# Patient Record
Sex: Male | Born: 1945 | Race: Black or African American | Hispanic: No | Smoking: Former smoker
Health system: Southern US, Community
[De-identification: ages and names within clinical notes are randomized; demographics above are authoritative.]

## PROBLEM LIST (undated history)

## (undated) DIAGNOSIS — Z8719 Personal history of other diseases of the digestive system: Secondary | ICD-10-CM

## (undated) DIAGNOSIS — K219 Gastro-esophageal reflux disease without esophagitis: Secondary | ICD-10-CM

## (undated) DIAGNOSIS — I10 Essential (primary) hypertension: Secondary | ICD-10-CM

## (undated) HISTORY — PX: ROTATOR CUFF REPAIR: SHX139

## (undated) HISTORY — PX: HERNIA REPAIR: SHX51

## (undated) HISTORY — PX: JOINT REPLACEMENT: SHX530

---

## 1999-01-26 ENCOUNTER — Ambulatory Visit (HOSPITAL_COMMUNITY): Admission: RE | Admit: 1999-01-26 | Discharge: 1999-01-26 | Payer: Self-pay | Admitting: *Deleted

## 1999-07-07 ENCOUNTER — Other Ambulatory Visit: Admission: RE | Admit: 1999-07-07 | Discharge: 1999-07-07 | Payer: Self-pay | Admitting: Specialist

## 2000-01-26 ENCOUNTER — Ambulatory Visit (HOSPITAL_COMMUNITY): Admission: RE | Admit: 2000-01-26 | Discharge: 2000-01-26 | Payer: Self-pay

## 2000-07-11 ENCOUNTER — Encounter (INDEPENDENT_AMBULATORY_CARE_PROVIDER_SITE_OTHER): Payer: Self-pay

## 2000-07-11 ENCOUNTER — Ambulatory Visit (HOSPITAL_COMMUNITY): Admission: RE | Admit: 2000-07-11 | Discharge: 2000-07-11 | Payer: Self-pay | Admitting: *Deleted

## 2003-05-01 ENCOUNTER — Ambulatory Visit (HOSPITAL_COMMUNITY): Admission: RE | Admit: 2003-05-01 | Discharge: 2003-05-01 | Payer: Self-pay | Admitting: *Deleted

## 2003-05-01 ENCOUNTER — Encounter (INDEPENDENT_AMBULATORY_CARE_PROVIDER_SITE_OTHER): Payer: Self-pay | Admitting: Specialist

## 2006-08-02 ENCOUNTER — Encounter (INDEPENDENT_AMBULATORY_CARE_PROVIDER_SITE_OTHER): Payer: Self-pay | Admitting: Specialist

## 2006-08-02 ENCOUNTER — Ambulatory Visit (HOSPITAL_COMMUNITY): Admission: RE | Admit: 2006-08-02 | Discharge: 2006-08-02 | Payer: Self-pay | Admitting: *Deleted

## 2008-08-06 ENCOUNTER — Ambulatory Visit (HOSPITAL_COMMUNITY): Admission: RE | Admit: 2008-08-06 | Discharge: 2008-08-06 | Payer: Self-pay | Admitting: *Deleted

## 2009-02-20 ENCOUNTER — Encounter: Admission: RE | Admit: 2009-02-20 | Discharge: 2009-02-20 | Payer: Self-pay | Admitting: *Deleted

## 2009-08-15 ENCOUNTER — Encounter: Admission: RE | Admit: 2009-08-15 | Discharge: 2009-08-15 | Payer: Self-pay | Admitting: Internal Medicine

## 2009-10-07 ENCOUNTER — Other Ambulatory Visit: Admission: RE | Admit: 2009-10-07 | Discharge: 2009-10-07 | Payer: Self-pay | Admitting: Otolaryngology

## 2009-11-16 ENCOUNTER — Emergency Department (HOSPITAL_COMMUNITY): Admission: EM | Admit: 2009-11-16 | Discharge: 2009-11-16 | Payer: Self-pay | Admitting: Emergency Medicine

## 2009-11-24 ENCOUNTER — Ambulatory Visit (HOSPITAL_COMMUNITY): Admission: RE | Admit: 2009-11-24 | Discharge: 2009-11-25 | Payer: Self-pay | Admitting: Otolaryngology

## 2009-11-24 ENCOUNTER — Encounter (INDEPENDENT_AMBULATORY_CARE_PROVIDER_SITE_OTHER): Payer: Self-pay | Admitting: Otolaryngology

## 2010-04-13 ENCOUNTER — Inpatient Hospital Stay (HOSPITAL_COMMUNITY): Admission: RE | Admit: 2010-04-13 | Discharge: 2010-04-14 | Payer: Self-pay | Admitting: Specialist

## 2011-01-11 LAB — DIFFERENTIAL
Basophils Absolute: 0.1 10*3/uL (ref 0.0–0.1)
Basophils Relative: 1 % (ref 0–1)
Eosinophils Absolute: 0.2 10*3/uL (ref 0.0–0.7)
Eosinophils Relative: 2 % (ref 0–5)
Lymphocytes Relative: 25 % (ref 12–46)
Lymphs Abs: 2.2 10*3/uL (ref 0.7–4.0)
Monocytes Absolute: 0.6 10*3/uL (ref 0.1–1.0)
Monocytes Relative: 7 % (ref 3–12)
Neutro Abs: 5.8 10*3/uL (ref 1.7–7.7)
Neutrophils Relative %: 65 % (ref 43–77)

## 2011-01-11 LAB — CBC
HCT: 42.2 % (ref 39.0–52.0)
HCT: 45.4 % (ref 39.0–52.0)
Hemoglobin: 14.5 g/dL (ref 13.0–17.0)
Hemoglobin: 15.6 g/dL (ref 13.0–17.0)
MCHC: 34.3 g/dL (ref 30.0–36.0)
MCHC: 34.4 g/dL (ref 30.0–36.0)
MCV: 100.4 fL — ABNORMAL HIGH (ref 78.0–100.0)
MCV: 101.3 fL — ABNORMAL HIGH (ref 78.0–100.0)
Platelets: 166 10*3/uL (ref 150–400)
Platelets: 176 10*3/uL (ref 150–400)
RBC: 4.21 MIL/uL — ABNORMAL LOW (ref 4.22–5.81)
RBC: 4.48 MIL/uL (ref 4.22–5.81)
RDW: 13 % (ref 11.5–15.5)
RDW: 13.5 % (ref 11.5–15.5)
WBC: 8.5 10*3/uL (ref 4.0–10.5)
WBC: 8.9 10*3/uL (ref 4.0–10.5)

## 2011-01-11 LAB — BASIC METABOLIC PANEL
BUN: 15 mg/dL (ref 6–23)
BUN: 18 mg/dL (ref 6–23)
CO2: 24 mEq/L (ref 19–32)
CO2: 25 mEq/L (ref 19–32)
Calcium: 9.8 mg/dL (ref 8.4–10.5)
Calcium: 9.8 mg/dL (ref 8.4–10.5)
Chloride: 101 mEq/L (ref 96–112)
Chloride: 105 mEq/L (ref 96–112)
Creatinine, Ser: 0.82 mg/dL (ref 0.4–1.5)
Creatinine, Ser: 1.04 mg/dL (ref 0.4–1.5)
GFR calc Af Amer: >60 mL/min (ref >60)
GFR calc Af Amer: >60 mL/min (ref >60)
GFR calc non Af Amer: >60 mL/min (ref >60)
GFR calc non Af Amer: >60 mL/min (ref >60)
Glucose, Bld: 101 mg/dL — ABNORMAL HIGH (ref 70–99)
Glucose, Bld: 101 mg/dL — ABNORMAL HIGH (ref 70–99)
Potassium: 3.2 mEq/L — ABNORMAL LOW (ref 3.5–5.1)
Potassium: 4.1 mEq/L (ref 3.5–5.1)
Sodium: 133 mEq/L — ABNORMAL LOW (ref 135–145)
Sodium: 138 mEq/L (ref 135–145)

## 2011-01-11 LAB — SURGICAL PCR SCREEN
MRSA, PCR: NEGATIVE
Staphylococcus aureus: NEGATIVE

## 2011-01-11 LAB — POTASSIUM: Potassium: 4.2 mEq/L (ref 3.5–5.1)

## 2011-01-11 LAB — PROTIME-INR
INR: 0.91 (ref 0.00–1.49)
Prothrombin Time: 12.2 seconds (ref 11.6–15.2)

## 2011-03-09 NOTE — Op Note (Signed)
NAMEMAURI, TOLEN NO.:  [id number]   MEDICAL RECORD NO.:  [id number]          PATIENT TYPE:  AMB   LOCATION:  ENDO                         FACILITY:  [facility]   PHYSICIAN:  Georgiana Spinner, M.D.    DATE OF BIRTH:  [birth year]   DATE OF PROCEDURE:  08/06/2008  DATE OF DISCHARGE:                               OPERATIVE REPORT   PROCEDURE:  Colonoscopy.   INDICATIONS:  Colon polyps.   ANESTHESIA:  Fentanyl 125 mcg, Versed 12 mg.   PROCEDURE:  With the patient mildly sedated in the left lateral  decubitus position the Pentax videoscopic colonoscope was inserted in  the rectum after normal rectal exam was performed and passed under  direct vision to the cecum with pressure applied and the patient rolled  to his back.  The cecum was identified by ileocecal valve and  appendiceal orifice, both of which were photographed.  From this point  the colonoscope was slowly withdrawn taking circumferential views of  colonic mucosa, stopping only in the rectum which appeared normal on  direct and showed hemorrhoids on retroflexed view.  The endoscope was  straightened and withdrawn.  The patient's vital signs, pulse oximeter  remained stable.  The patient tolerated procedure well without apparent  complications.   FINDINGS:  Internal hemorrhoids, otherwise unremarkable examination.   PLAN:  Repeat examination in 5 years.           ______________________________  Georgiana Spinner, M.D.     GMO/MEDQ  D:  08/06/2008  T:  08/06/2008  Job:  [id number]

## 2011-03-12 NOTE — Op Note (Signed)
NAMEAJIT, ERRICO NO.:  [id number]   MEDICAL RECORD NO.:  [id number]          PATIENT TYPE:  AMB   LOCATION:  ENDO                         FACILITY:  [facility]   PHYSICIAN:  Georgiana Spinner, M.D.    DATE OF BIRTH:  [birth year]   DATE OF PROCEDURE:  08/02/2006  DATE OF DISCHARGE:                                 OPERATIVE REPORT   PROCEDURE:  Upper endoscopy with biopsy.   INDICATIONS:  GERD.   ANESTHESIA:  Demerol 50 and Versed 5 mg.   PROCEDURE:  With the patient mildly sedated in the left lateral decubitus  position, the Olympus videoscopic endoscope was inserted in the mouth and  passed under direct vision through the esophagus, which appeared normal  until we reached distal esophagus.  Photographs and biopsies of the areas of  possible Barrett's were taken.  We entered into the stomach.  Fundus, body,  antrum, duodenal bulb, second portion of duodenum were visualized.  From  this point, the endoscope was slowly withdrawn taking circumferential views  of duodenal mucosa until the endoscope had been pulled back into the  stomach, placed in retroflexion to view the stomach from below.  The  endoscope was straightened and withdrawn after taking biopsies of the antrum  and body of the stomach.  The patient's vital signs and pulse oximeter  remained stable.  The patient tolerated the procedure well without apparent  complications.   FINDINGS:  Question of Barrett's esophagus and gastritis of antrum and body  of stomach.  Await biopsy reports.  The patient will call me for results and  follow-up with me as an outpatient.           ______________________________  Georgiana Spinner, M.D.     GMO/MEDQ  D:  08/02/2006  T:  08/03/2006  Job:  [id number]

## 2011-03-12 NOTE — Op Note (Signed)
   NAMEJARICK, HARKINS NO.:  [id number]   MEDICAL RECORD NO.:  [id number]                   PATIENT TYPE:  AMB   LOCATION:  ENDO                                 [facility]ACILITY:  [facility]   PHYSICIAN:  Georgiana Spinner, M.D.                 DATE OF BIRTH:  1946/07/30   DATE OF PROCEDURE:  05/01/2003  DATE OF DISCHARGE:                                 OPERATIVE REPORT   PROCEDURE:  Colonoscopy.   INDICATIONS:  Colon polyps.   ANESTHESIA:  Demerol 40 mg, Versed 3 mg.   DESCRIPTION OF PROCEDURE:  With the patient mildly sedated in the left  lateral decubitus position, the Olympus videoscopic colonoscope was inserted  in the rectum and passed under direct vision to the cecum, identified by the  ileocecal valve and appendiceal orifice, both of which were photographed.  From this point the colonoscope was slowly withdrawn, taking circumferential  views of the entire colonic mucosa, stopping only in the rectum, which  appeared normal on direct and retroflexed view.  The endoscope was  straightened and withdrawn.  The patient's vital signs and pulse oximetry  remained stable.  The patient tolerated the procedure well without apparent  complications.   FINDINGS:  Unremarkable colonoscopic examination to the cecum.   PLAN:  Repeat examination in five years.                                               Georgiana Spinner, M.D.    GMO/MEDQ  D:  05/01/2003  T:  05/01/2003  Job:  [id number]

## 2011-03-12 NOTE — Procedures (Signed)
[facility]  Patient:    [name], [name]                        MRN: [id number] Proc. Date: 07/11/00 Adm. Date:  [id number] Attending:  Sabino Gasser                           Procedure Report  PROCEDURE:  Colonoscopy with polypectomy and biopsy.  INDICATION FOR PROCEDURE:  Follow-up of polyps seen previously.  ANESTHESIA:  Demerol 70 mg, Versed 7.5 mg.  DESCRIPTION OF PROCEDURE:  With the patient mildly sedated in the left lateral decubitus position, the Olympus videoscopic colonoscope was inserted in the rectum after a normal rectal exam and passed under vision to the cecum. The cecum was identified by the ileocecal valve and appendiceal orifice both of which were photographed. From this point, the colonoscope was slowly withdrawn taking circumferential views of the entire colonic mucosa, stopping only in ascending colon just above the ileocecal valve where a small sessile polyp was seen, photographed and removed using hot biopsy forceps technique on a setting of 3:3 blended current. We also then stopped in the proximal transverse colon just distal to the hepatic flexure at which point a larger polyp was seen, photographed and removed using snare cautery technique once again at a setting of 3:3 blended current. Each were retained for pathology. The endoscope was then withdrawn as stated to the rectum which appeared normal in direct view and showed internal hemorrhoids on retroflexed view. The endoscope was straightened and withdrawn. The patients vital signs and pulse oximeter remained stable. The patient tolerated the procedure well without apparent complications.  FINDINGS:   Polyp of ascending colon, polyp of the proximal transverse colon. Await biopsy report. The patient will call me for results and follow-up with me as an outpatient. DD:  07/11/00 TD:  07/12/00 Job: [id number]

## 2011-03-12 NOTE — Op Note (Signed)
   NAMENAHSIR, VENEZIA NO.:  [id number]   MEDICAL RECORD NO.:  [id number]                   PATIENT TYPE:  AMB   LOCATION:  ENDO                                 FACILITY:  [facility]   PHYSICIAN:  Georgiana Spinner, M.D.                 DATE OF BIRTH:  05-Jun-1946   DATE OF PROCEDURE:  05/01/2003  DATE OF DISCHARGE:                                 OPERATIVE REPORT   PROCEDURE:  Upper endoscopy.   INDICATIONS:  GERD.   ANESTHESIA:  Demerol 60 mg, Versed 7 mg.   PROCEDURE:  With the patient mildly sedated in the left lateral decubitus  position, the Olympus videoscopic endoscope was inserted in the mouth,  passed under direct vision through the esophagus, which appeared normal.  The distal esophagus was approached and biopsies were taken to rule out  Barrett's.  We advanced in the stomach.  The fundus, body, antrum, duodenal  bulb, and second portion of the duodenum appeared normal.  From this point  the endoscope was slowly withdrawn, taking circumferential views of the  duodenal mucosa until the endoscope had been pulled back into the stomach,  placed in retroflexion to view the stomach from below.  The endoscope was  then straightened and withdrawn, taking circumferential views of the  remaining gastric and esophageal mucosa.  The patient's vital signs and  pulse oximetry remained stable.  The patient tolerated the procedure well  without apparent complications.   FINDINGS:  Question of Barrett's esophagus, biopsied.  Await biopsy report.  The patient will call me for results and follow up with me as an outpatient.  Proceed to colonoscopy as planned.                                               Georgiana Spinner, M.D.    GMO/MEDQ  D:  05/01/2003  T:  05/01/2003  Job:  [id number]

## 2011-03-18 ENCOUNTER — Encounter (HOSPITAL_COMMUNITY): Payer: Self-pay | Admitting: Radiology

## 2011-03-18 ENCOUNTER — Emergency Department (HOSPITAL_COMMUNITY): Payer: 59

## 2011-03-18 ENCOUNTER — Emergency Department (HOSPITAL_COMMUNITY)
Admission: EM | Admit: 2011-03-18 | Discharge: 2011-03-18 | Disposition: A | Discharge status: Home / Self Care | Payer: 59 | Attending: Emergency Medicine | Admitting: Emergency Medicine

## 2011-03-18 DIAGNOSIS — R51 Headache: Secondary | ICD-10-CM | POA: Insufficient documentation

## 2011-03-18 DIAGNOSIS — I10 Essential (primary) hypertension: Secondary | ICD-10-CM | POA: Insufficient documentation

## 2011-03-18 DIAGNOSIS — K219 Gastro-esophageal reflux disease without esophagitis: Secondary | ICD-10-CM | POA: Insufficient documentation

## 2011-03-18 DIAGNOSIS — Z79899 Other long term (current) drug therapy: Secondary | ICD-10-CM | POA: Insufficient documentation

## 2011-03-18 HISTORY — DX: Essential (primary) hypertension: I10

## 2011-03-18 LAB — DIFFERENTIAL
Basophils Absolute: 0 10*3/uL (ref 0.0–0.1)
Basophils Relative: 1 % (ref 0–1)
Eosinophils Absolute: 0.1 10*3/uL (ref 0.0–0.7)
Eosinophils Relative: 2 % (ref 0–5)
Lymphocytes Relative: 43 % (ref 12–46)
Lymphs Abs: 3 10*3/uL (ref 0.7–4.0)
Monocytes Absolute: 0.6 10*3/uL (ref 0.1–1.0)
Monocytes Relative: 8 % (ref 3–12)
Neutro Abs: 3.3 10*3/uL (ref 1.7–7.7)
Neutrophils Relative %: 47 % (ref 43–77)

## 2011-03-18 LAB — BASIC METABOLIC PANEL
BUN: 13 mg/dL (ref 6–23)
CO2: 23 mEq/L (ref 19–32)
Calcium: 9.9 mg/dL (ref 8.4–10.5)
Chloride: 103 mEq/L (ref 96–112)
Creatinine, Ser: 0.9 mg/dL (ref 0.4–1.5)
GFR calc Af Amer: >60 mL/min (ref >60)
GFR calc non Af Amer: >60 mL/min (ref >60)
Glucose, Bld: 97 mg/dL (ref 70–99)
Potassium: 3.6 mEq/L (ref 3.5–5.1)
Sodium: 138 mEq/L (ref 135–145)

## 2011-03-18 LAB — CBC
HCT: 42.6 % (ref 39.0–52.0)
Hemoglobin: 15.6 g/dL (ref 13.0–17.0)
MCH: 34.3 pg — ABNORMAL HIGH (ref 26.0–34.0)
MCHC: 36.6 g/dL — ABNORMAL HIGH (ref 30.0–36.0)
MCV: 93.6 fL (ref 78.0–100.0)
Platelets: 176 10*3/uL (ref 150–400)
RBC: 4.55 MIL/uL (ref 4.22–5.81)
RDW: 13.1 % (ref 11.5–15.5)
WBC: 7 10*3/uL (ref 4.0–10.5)

## 2013-12-20 ENCOUNTER — Other Ambulatory Visit: Payer: Self-pay | Admitting: Orthopaedic Surgery

## 2013-12-20 DIAGNOSIS — M25561 Pain in right knee: Secondary | ICD-10-CM

## 2013-12-25 ENCOUNTER — Ambulatory Visit
Admission: RE | Admit: 2013-12-25 | Discharge: 2013-12-25 | Disposition: A | Discharge status: Home / Self Care | Payer: 59 | Source: Ambulatory Visit | Attending: Orthopaedic Surgery | Admitting: Orthopaedic Surgery

## 2013-12-25 DIAGNOSIS — M25561 Pain in right knee: Secondary | ICD-10-CM

## 2014-03-14 ENCOUNTER — Encounter (HOSPITAL_COMMUNITY): Payer: Self-pay | Admitting: Pharmacy Technician

## 2014-03-14 NOTE — H&P (Signed)
Norlene CampbellPeter Whitfield, MD   Jacqualine CodeBrian Jolyn Deshmukh, PA-C [address], [place], [place]  [id number]                             [phone]   ORTHOPAEDIC HISTORY & PHYSICAL  Katherine BassetWoody Deacon MRN:  [id number] DOB/SEX:  04/06/1946/male  CHIEF COMPLAINT:  Painful right knee  HISTORY: Mr. [name], over the past year has been having some problems with a grinding and swelling in the right knee.  In the year 2000 Dr. Thomasena Edisollins had done an arthroscopy and he was told that he would probably need a knee replacement within 10 years.  He is most recently, though, having knee weakness and pain more medial than lateral.  He did not have any recent history of injury or trauma.  He has difficulty with walking and standing.  He also is having some effusions noted.  He does have occasional catching, but locking, popping or giving way. MRI was ordered.   The MRI scan reveals a complex tear of the posterior horn and body and junction at the medial meniscus.  A lateral meniscus was intact.  ACL and PCL were intact.  He had a high-grade partial-thickness cartilage loss involving the lateral patella facet and lateral trochlea.  It was full-thickness cartilage loss involving the medial femoral condyle and medial tibial plateau with subchondral reactive marrow changes in the peripheral aspect of the medial tibial plateau.  There were marginal osteophytes.  In the lateral compartment, there is cartilage irregularity with small area of high-grade partial-thickness cartilage loss involving the lateral femoral condyle as well as a lateral tibial plateau with some marginal osteophytes.   We have talked about arthroscopic debridement and what it may or may not accomplish.  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  He is also experiencing mechanical symptoms so I think that is a reasonable  approach.  He needs to just check with his work.  He may be out of work a couple of weeks or more.  We talked about the anesthesia and he will let us know.  That surgery would include an arthroscopic partial medial meniscectomy and probably chondroplasty in all 3 compartments.       PAST MEDICAL HISTORY: There are no active problems to display for this patient.  Past Medical History  Diagnosis Date  . Hypertension   . GERD (gastroesophageal reflux disease)   . H/O hiatal hernia    Past Surgical History  Procedure Laterality Date  . Rotator cuff repair    . Joint replacement      scope  . Hernia repair       MEDICATIONS:   Prescriptions prior to admission  Medication Sig Dispense Refill  . amLODipine-olmesartan (AZOR) 10-40 MG per tablet Take 1 tablet by mouth daily.      Marland Kitchen. esomeprazole (NEXIUM) 40 MG capsule Take 40 mg by mouth daily as needed (for reflux).      . metoprolol succinate (TOPROL-XL) 25 MG 24 hr tablet Take 25 mg by mouth daily.      . polyvinyl alcohol (LIQUIFILM TEARS) 1.4 % ophthalmic solution Place 1 drop into both eyes daily as needed for dry eyes.        ALLERGIES:  No Known Allergies  REVIEW OF SYSTEMS:  A comprehensive review  of systems was negative except for: Cardiovascular: positive for hypertension   FAMILY HISTORY:  History reviewed. No pertinent family history.  SOCIAL HISTORY:   History  Substance Use Topics  . Smoking status: Former Games developermoker  . Smokeless tobacco: Not on file  . Alcohol Use: Yes     Comment: daily      EXAMINATION: Vital signs in last 24 hours: Temp:  [97.6 F (36.4 C)] 97.6 F (36.4 C) (06/02 0853) Pulse Rate:  [51-57] 56 (06/02 1003) Resp:  [12-18] 12 (06/02 1003) BP: (107-136)/(73-95) 109/73 mmHg (06/02 1003) SpO2:  [96 %-98 %] 97 % (06/02 1003) Weight:  [95.992 kg (211 lb 10 oz)] 95.992 kg (211 lb 10 oz) (06/02 0853)  Head is normocephalic.   Eyes:  Pupils equal, round and reactive to light and accommodation.   Extraocular intact. ENT: Ears, nose, and throat were benign.   Neck: supple, no bruits were noted.   Chest: good expansion.   Lungs: essentially clear.   Cardiac: regular rhythm and rate, normal S1, S2.  No murmurs appreciated. Pulses :  1+ bilateral and symmetric in lower extremities. Abdomen is scaphoid, soft, nontender, no masses palpable, normal bowel sounds present. CNS:  He is oriented x3 and cranial nerves II-XII grossly intact. Breast, rectal, and genital exams: not performed and not indicated for an orthopedic evaluation. Musculoskeletal: Today he has range of motion from 0 degrees to about 105 degrees.  He does open with valgus stressing at the medial joint line.  I can palpate periarticular spurs about the medial joint line.  He does have crepitus with range of motion and this is more in the patellofemoral area.  Otherwise, he appears ligamentously stable.  Smooth motion of both hips.  Neurovascularly intact distally.     Imaging Review The MRI scan reveals a complex tear of the posterior horn and body and junction at the medial meniscus.  A lateral meniscus was intact.  ACL and PCL were intact.  He had a high-grade partial-thickness cartilage loss involving the lateral patella facet and lateral trochlea.  It was full-thickness cartilage loss involving the medial femoral condyle and medial tibial plateau with subchondral reactive marrow changes in the peripheral aspect of the medial tibial plateau.  There were marginal osteophytes.  In the lateral compartment, there is cartilage irregularity with small area of high-grade partial-thickness cartilage loss involving the lateral femoral condyle as well as a lateral tibial plateau with some marginal osteophytes.    ASSESSMENT: right complex tear of the posterior horn and body knee OA right knee  Past Medical History  Diagnosis Date  . Hypertension   . GERD (gastroesophageal reflux disease)   . H/O hiatal hernia     PLAN: Plan for  right arthroscopic debridement knee  The procedure,  risks, and benefits of surgery were presented and reviewed. The risks including but not limited to infection, blood clots, vascular and nerve injury, stiffness,  among others were discussed. The patient acknowledged the explanation, agreed to proceed.   Jacqualine CodeBrian Ladeja Pelham 03/26/2014, 10:06 AM

## 2014-03-15 ENCOUNTER — Ambulatory Visit (HOSPITAL_COMMUNITY)
Admission: RE | Admit: 2014-03-15 | Discharge: 2014-03-15 | Disposition: A | Discharge status: Home / Self Care | Payer: 59 | Source: Ambulatory Visit | Attending: Orthopaedic Surgery | Admitting: Orthopaedic Surgery

## 2014-03-15 ENCOUNTER — Encounter (HOSPITAL_COMMUNITY)
Admission: RE | Admit: 2014-03-15 | Discharge: 2014-03-15 | Disposition: A | Discharge status: Home / Self Care | Payer: 59 | Source: Ambulatory Visit | Attending: Orthopaedic Surgery | Admitting: Orthopaedic Surgery

## 2014-03-15 ENCOUNTER — Inpatient Hospital Stay (HOSPITAL_COMMUNITY): Admission: RE | Admit: 2014-03-15 | Payer: 59 | Source: Ambulatory Visit

## 2014-03-15 ENCOUNTER — Encounter (HOSPITAL_COMMUNITY): Payer: Self-pay

## 2014-03-15 DIAGNOSIS — Z01810 Encounter for preprocedural cardiovascular examination: Secondary | ICD-10-CM | POA: Insufficient documentation

## 2014-03-15 DIAGNOSIS — I10 Essential (primary) hypertension: Secondary | ICD-10-CM | POA: Insufficient documentation

## 2014-03-15 DIAGNOSIS — Z01812 Encounter for preprocedural laboratory examination: Secondary | ICD-10-CM | POA: Insufficient documentation

## 2014-03-15 DIAGNOSIS — Z01818 Encounter for other preprocedural examination: Secondary | ICD-10-CM | POA: Insufficient documentation

## 2014-03-15 HISTORY — DX: Personal history of other diseases of the digestive system: Z87.19

## 2014-03-15 HISTORY — DX: Gastro-esophageal reflux disease without esophagitis: K21.9

## 2014-03-15 LAB — BASIC METABOLIC PANEL
BUN: 10 mg/dL (ref 6–23)
CO2: 21 mEq/L (ref 19–32)
Calcium: 9.1 mg/dL (ref 8.4–10.5)
Chloride: 105 mEq/L (ref 96–112)
Creatinine, Ser: 0.88 mg/dL (ref 0.50–1.35)
GFR calc Af Amer: >90 mL/min (ref >90)
GFR calc non Af Amer: 88 mL/min — ABNORMAL LOW (ref >90)
Glucose, Bld: 123 mg/dL — ABNORMAL HIGH (ref 70–99)
Potassium: 4.2 mEq/L (ref 3.7–5.3)
Sodium: 139 mEq/L (ref 137–147)

## 2014-03-15 LAB — CBC
HCT: 43.2 % (ref 39.0–52.0)
Hemoglobin: 15.1 g/dL (ref 13.0–17.0)
MCH: 34.3 pg — ABNORMAL HIGH (ref 26.0–34.0)
MCHC: 35 g/dL (ref 30.0–36.0)
MCV: 98.2 fL (ref 78.0–100.0)
Platelets: 142 10*3/uL — ABNORMAL LOW (ref 150–400)
RBC: 4.4 MIL/uL (ref 4.22–5.81)
RDW: 13.1 % (ref 11.5–15.5)
WBC: 6.6 10*3/uL (ref 4.0–10.5)

## 2014-03-15 MED ORDER — CHLORHEXIDINE GLUCONATE 4 % EX LIQD: 60.0000 mL | Freq: Once | CUTANEOUS | Status: DC

## 2014-03-15 NOTE — Pre-Procedure Instructions (Signed)
[name]  03/15/2014   Your procedure is scheduled on:  03/26/14  Report to [facility] Admitting at 830 AM.  Call this number if you have problems the morning of surgery: [phone]   Remember:   Do not eat food or drink liquids after midnight.   Take these medicines the morning of surgery with A SIP OF WATER: nexium,toprol   Do not wear jewelry, make-up or nail polish.  Do not wear lotions, powders, or perfumes. You may wear deodorant.  Do not shave 48 hours prior to surgery. Men may shave face and neck.  Do not bring valuables to the hospital.  [facility] is not responsible                  for any belongings or valuables.               Contacts, dentures or bridgework may not be worn into surgery.  Leave suitcase in the car. After surgery it may be brought to your room.  For patients admitted to the hospital, discharge time is determined by your                treatment team.               Patients discharged the day of surgery will not be allowed to drive  home.  Name and phone number of your driver: family  Special Instructions: Incentive Spirometry - Practice and bring it with you on the day of surgery.   Please read over the following fact sheets that you were given: Pain Booklet, Coughing and Deep Breathing and Surgical Site Infection Prevention

## 2014-03-15 NOTE — Pre-Procedure Instructions (Signed)
Da Beisel  03/15/2014   Your procedure is scheduled on:  03/25/14  Report to [facility] Admitting at 530 AM.  Call this number if you have problems the morning of surgery: [phone]   Remember:   Do not eat food or drink liquids after midnight.   Take these medicines the morning of surgery with A SIP OF WATER: nexium,metoprolol   Do not wear jewelry, make-up or nail polish.  Do not wear lotions, powders, or perfumes. You may wear deodorant.  Do not shave 48 hours prior to surgery. Men may shave face and neck.  Do not bring valuables to the hospital.  [facility] is not responsible                  for any belongings or valuables.               Contacts, dentures or bridgework may not be worn into surgery.  Leave suitcase in the car. After surgery it may be brought to your room.  For patients admitted to the hospital, discharge time is determined by your                treatment team.               Patients discharged the day of surgery will not be allowed to drive  home.  Name and phone number of your driver: driver  Special Instructions: Shower using CHG 2 nights before surgery and the night before surgery.  If you shower the day of surgery use CHG.  Use special wash - you have one bottle of CHG for all showers.  You should use approximately 1/3 of the bottle for each shower.   Please read over the following fact sheets that you were given: Pain Booklet, Coughing and Deep Breathing, Blood Transfusion Information, Total Joint Packet, MRSA Information and Surgical Site Infection Prevention

## 2014-03-25 MED ORDER — SODIUM CHLORIDE 0.9 % IV SOLN: INTRAVENOUS | Status: DC

## 2014-03-26 ENCOUNTER — Ambulatory Visit (HOSPITAL_COMMUNITY): Payer: 59 | Admitting: Anesthesiology

## 2014-03-26 ENCOUNTER — Encounter (HOSPITAL_COMMUNITY): Payer: 59 | Admitting: Anesthesiology

## 2014-03-26 ENCOUNTER — Encounter (HOSPITAL_COMMUNITY): Payer: Self-pay | Admitting: *Deleted

## 2014-03-26 ENCOUNTER — Ambulatory Visit (HOSPITAL_COMMUNITY)
Admission: RE | Admit: 2014-03-26 | Discharge: 2014-03-26 | Disposition: A | Discharge status: Home / Self Care | Payer: 59 | Source: Ambulatory Visit | Attending: Orthopaedic Surgery | Admitting: Orthopaedic Surgery

## 2014-03-26 ENCOUNTER — Encounter (HOSPITAL_COMMUNITY)
Admission: RE | Disposition: A | Discharge status: Home / Self Care | Payer: Self-pay | Source: Ambulatory Visit | Attending: Orthopaedic Surgery

## 2014-03-26 DIAGNOSIS — M1710 Unilateral primary osteoarthritis, unspecified knee: Secondary | ICD-10-CM | POA: Insufficient documentation

## 2014-03-26 DIAGNOSIS — K219 Gastro-esophageal reflux disease without esophagitis: Secondary | ICD-10-CM | POA: Insufficient documentation

## 2014-03-26 DIAGNOSIS — Z87891 Personal history of nicotine dependence: Secondary | ICD-10-CM | POA: Insufficient documentation

## 2014-03-26 DIAGNOSIS — Z79899 Other long term (current) drug therapy: Secondary | ICD-10-CM | POA: Insufficient documentation

## 2014-03-26 DIAGNOSIS — M1711 Unilateral primary osteoarthritis, right knee: Secondary | ICD-10-CM | POA: Diagnosis present

## 2014-03-26 DIAGNOSIS — Z9889 Other specified postprocedural states: Secondary | ICD-10-CM

## 2014-03-26 DIAGNOSIS — M2240 Chondromalacia patellae, unspecified knee: Secondary | ICD-10-CM | POA: Insufficient documentation

## 2014-03-26 DIAGNOSIS — M6580 Other synovitis and tenosynovitis, unspecified site: Secondary | ICD-10-CM | POA: Insufficient documentation

## 2014-03-26 DIAGNOSIS — I10 Essential (primary) hypertension: Secondary | ICD-10-CM | POA: Insufficient documentation

## 2014-03-26 DIAGNOSIS — M23329 Other meniscus derangements, posterior horn of medial meniscus, unspecified knee: Secondary | ICD-10-CM | POA: Insufficient documentation

## 2014-03-26 DIAGNOSIS — M23203 Derangement of unspecified medial meniscus due to old tear or injury, right knee: Secondary | ICD-10-CM | POA: Diagnosis present

## 2014-03-26 HISTORY — PX: KNEE ARTHROSCOPY WITH MEDIAL MENISECTOMY: SHX5651

## 2014-03-26 SURGERY — ARTHROSCOPY, KNEE, WITH MEDIAL MENISCECTOMY
Anesthesia: General | Site: Knee | Laterality: Right

## 2014-03-26 MED ORDER — MIDAZOLAM HCL 2 MG/2ML IJ SOLN
INTRAMUSCULAR | Status: AC
  Administered 2014-03-26: 2 mg via INTRAVENOUS
  Filled 2014-03-26: qty 2

## 2014-03-26 MED ORDER — MEPERIDINE HCL 25 MG/ML IJ SOLN: 6.2500 mg | INTRAMUSCULAR | Status: DC | PRN

## 2014-03-26 MED ORDER — LIDOCAINE-EPINEPHRINE 1 %-1:100000 IJ SOLN
INTRAMUSCULAR | Status: AC
  Filled 2014-03-26: qty 1

## 2014-03-26 MED ORDER — OXYCODONE HCL 5 MG PO TABS
5.0000 mg | ORAL_TABLET | Freq: Once | ORAL | Status: AC | PRN
  Administered 2014-03-26: 5 mg via ORAL

## 2014-03-26 MED ORDER — ONDANSETRON HCL 4 MG/2ML IJ SOLN
INTRAMUSCULAR | Status: AC
  Filled 2014-03-26: qty 2

## 2014-03-26 MED ORDER — FENTANYL CITRATE 0.05 MG/ML IJ SOLN
INTRAMUSCULAR | Status: DC | PRN
  Administered 2014-03-26 (×3): 50 ug via INTRAVENOUS
  Administered 2014-03-26: 100 ug via INTRAVENOUS

## 2014-03-26 MED ORDER — MIDAZOLAM HCL 5 MG/5ML IJ SOLN
INTRAMUSCULAR | Status: DC | PRN
  Administered 2014-03-26: 2 mg via INTRAVENOUS

## 2014-03-26 MED ORDER — LACTATED RINGERS IV SOLN
INTRAVENOUS | Status: DC | PRN
  Administered 2014-03-26 (×2): via INTRAVENOUS

## 2014-03-26 MED ORDER — OXYCODONE HCL 5 MG PO TABS
ORAL_TABLET | ORAL | Status: AC
  Filled 2014-03-26: qty 1

## 2014-03-26 MED ORDER — LACTATED RINGERS IV SOLN
INTRAVENOUS | Status: DC
  Administered 2014-03-26: 09:00:00 via INTRAVENOUS

## 2014-03-26 MED ORDER — MIDAZOLAM HCL 2 MG/2ML IJ SOLN
INTRAMUSCULAR | Status: AC
  Filled 2014-03-26: qty 2

## 2014-03-26 MED ORDER — OXYCODONE HCL 5 MG/5ML PO SOLN
5.0000 mg | Freq: Once | ORAL | Status: AC | PRN
Start: 2014-03-26 — End: 2014-03-26

## 2014-03-26 MED ORDER — LIDOCAINE HCL (CARDIAC) 20 MG/ML IV SOLN
INTRAVENOUS | Status: DC | PRN
  Administered 2014-03-26: 100 mg via INTRAVENOUS

## 2014-03-26 MED ORDER — ONDANSETRON HCL 4 MG/2ML IJ SOLN
INTRAMUSCULAR | Status: DC | PRN
  Administered 2014-03-26: 4 mg via INTRAVENOUS

## 2014-03-26 MED ORDER — PROPOFOL 10 MG/ML IV BOLUS
INTRAVENOUS | Status: DC | PRN
  Administered 2014-03-26: 25 mg via INTRAVENOUS
  Administered 2014-03-26: 150 mg via INTRAVENOUS

## 2014-03-26 MED ORDER — DEXAMETHASONE SODIUM PHOSPHATE 4 MG/ML IJ SOLN
INTRAMUSCULAR | Status: DC | PRN
  Administered 2014-03-26: 8 mg via INTRAVENOUS

## 2014-03-26 MED ORDER — DEXAMETHASONE SODIUM PHOSPHATE 4 MG/ML IJ SOLN
INTRAMUSCULAR | Status: AC
  Filled 2014-03-26: qty 2

## 2014-03-26 MED ORDER — PROPOFOL 10 MG/ML IV BOLUS
INTRAVENOUS | Status: AC
  Filled 2014-03-26: qty 20

## 2014-03-26 MED ORDER — BUPIVACAINE-EPINEPHRINE 0.25% -1:200000 IJ SOLN
INTRAMUSCULAR | Status: DC | PRN
  Administered 2014-03-26: 30 mL

## 2014-03-26 MED ORDER — FENTANYL CITRATE 0.05 MG/ML IJ SOLN
INTRAMUSCULAR | Status: AC
  Administered 2014-03-26: 50 ug via INTRAVENOUS
  Filled 2014-03-26: qty 2

## 2014-03-26 MED ORDER — FENTANYL CITRATE 0.05 MG/ML IJ SOLN
INTRAMUSCULAR | Status: AC
  Filled 2014-03-26: qty 5

## 2014-03-26 MED ORDER — BUPIVACAINE-EPINEPHRINE (PF) 0.25% -1:200000 IJ SOLN
INTRAMUSCULAR | Status: AC
  Filled 2014-03-26: qty 30

## 2014-03-26 MED ORDER — ONDANSETRON HCL 4 MG/2ML IJ SOLN: 4.0000 mg | Freq: Once | INTRAMUSCULAR | Status: DC | PRN

## 2014-03-26 MED ORDER — SODIUM CHLORIDE 0.9 % IR SOLN
Status: DC | PRN
  Administered 2014-03-26 (×2): 3000 mL

## 2014-03-26 MED ORDER — HYDROMORPHONE HCL PF 1 MG/ML IJ SOLN: 0.2500 mg | INTRAMUSCULAR | Status: DC | PRN

## 2014-03-26 MED ORDER — OXYCODONE-ACETAMINOPHEN 5-325 MG PO TABS: 1.0000 | ORAL_TABLET | ORAL | Status: DC | PRN

## 2014-03-26 MED ORDER — BUPIVACAINE-EPINEPHRINE (PF) 0.5% -1:200000 IJ SOLN
INTRAMUSCULAR | Status: DC | PRN
  Administered 2014-03-26: 30 mL

## 2014-03-26 SURGICAL SUPPLY — 32 items
BANDAGE ELASTIC 6 VELCRO ST LF (GAUZE/BANDAGES/DRESSINGS) ×2 IMPLANT
BLADE CUDA 5.5 (BLADE) IMPLANT
BLADE GREAT WHITE 4.2 (BLADE) ×2 IMPLANT
BNDG GAUZE ELAST 4 BULKY (GAUZE/BANDAGES/DRESSINGS) ×1 IMPLANT
BUR OVAL 6.0 (BURR) IMPLANT
CUFF TOURNIQUET SINGLE 34IN LL (TOURNIQUET CUFF) IMPLANT
CUFF TOURNIQUET SINGLE 44IN (TOURNIQUET CUFF) IMPLANT
DRAPE ARTHROSCOPY W/POUCH 114 (DRAPES) ×2 IMPLANT
DRSG EMULSION OIL 3X3 NADH (GAUZE/BANDAGES/DRESSINGS) ×2 IMPLANT
DURAPREP 26ML APPLICATOR (WOUND CARE) ×3 IMPLANT
GLOVE BIOGEL PI IND STRL 8 (GLOVE) ×1 IMPLANT
GLOVE BIOGEL PI IND STRL 8.5 (GLOVE) ×1 IMPLANT
GLOVE BIOGEL PI INDICATOR 8 (GLOVE) ×1
GLOVE BIOGEL PI INDICATOR 8.5 (GLOVE) ×1
GLOVE ECLIPSE 8.0 STRL XLNG CF (GLOVE) ×3 IMPLANT
GLOVE SURG ORTHO 8.5 STRL (GLOVE) ×3 IMPLANT
GOWN STRL REUS W/ TWL LRG LVL3 (GOWN DISPOSABLE) ×2 IMPLANT
GOWN STRL REUS W/ TWL XL LVL3 (GOWN DISPOSABLE) ×1 IMPLANT
GOWN STRL REUS W/TWL LRG LVL3 (GOWN DISPOSABLE) ×4
GOWN STRL REUS W/TWL XL LVL3 (GOWN DISPOSABLE) ×2
KIT ROOM TURNOVER OR (KITS) ×2 IMPLANT
MANIFOLD NEPTUNE II (INSTRUMENTS) ×2 IMPLANT
PACK ARTHROSCOPY DSU (CUSTOM PROCEDURE TRAY) ×2 IMPLANT
PAD ARMBOARD 7.5X6 YLW CONV (MISCELLANEOUS) ×4 IMPLANT
SET ARTHROSCOPY TUBING (MISCELLANEOUS) ×2
SET ARTHROSCOPY TUBING LN (MISCELLANEOUS) ×1 IMPLANT
SPONGE GAUZE 4X4 12PLY (GAUZE/BANDAGES/DRESSINGS) ×2 IMPLANT
SPONGE LAP 4X18 X RAY DECT (DISPOSABLE) ×2 IMPLANT
TOWEL OR 17X24 6PK STRL BLUE (TOWEL DISPOSABLE) ×4 IMPLANT
WAND 90 DEG TURBOVAC W/CORD (SURGICAL WAND) ×2 IMPLANT
WAND HAND CNTRL MULTIVAC 90 (MISCELLANEOUS) ×2 IMPLANT
WATER STERILE IRR 1000ML POUR (IV SOLUTION) ×2 IMPLANT

## 2014-03-26 NOTE — Anesthesia Preprocedure Evaluation (Signed)
Anesthesia Evaluation  Patient identified by MRN, date of birth, ID band Patient awake    Reviewed: Allergy & Precautions, H&P , NPO status , Patient's Chart, lab work & pertinent test results  Airway Mallampati: I  TM Distance: >3 FB Neck ROM: Full    Dental   Pulmonary former smoker,          Cardiovascular hypertension, Pt. on medications     Neuro/Psych    GI/Hepatic   Endo/Other    Renal/GU      Musculoskeletal   Abdominal   Peds  Hematology   Anesthesia Other Findings   Reproductive/Obstetrics                             Anesthesia Physical Anesthesia Plan  ASA: II  Anesthesia Plan: General   Post-op Pain Management:    Induction: Intravenous  Airway Management Planned: LMA  Additional Equipment:   Intra-op Plan:   Post-operative Plan: Extubation in OR  Informed Consent: I have reviewed the patients History and Physical, chart, labs and discussed the procedure including the risks, benefits and alternatives for the proposed anesthesia with the patient or authorized representative who has indicated his/her understanding and acceptance.     Plan Discussed with: CRNA and Surgeon  Anesthesia Plan Comments:         Anesthesia Quick Evaluation  

## 2014-03-26 NOTE — Anesthesia Postprocedure Evaluation (Signed)
Anesthesia Post Note  Patient: [name]  Procedure(s) Performed: Procedure(s) (LRB): KNEE ARTHROSCOPY WITH MEDIAL MENISECTOMY, POSSIBLE CHONDROPLASTY. (Right)  Anesthesia type: general  Patient location: PACU  Post pain: Pain level controlled  Post assessment: Patient's Cardiovascular Status Stable  Last Vitals:  Filed Vitals:   03/26/14 1230  BP:   Pulse:   Temp:   Resp: 16    Post vital signs: Reviewed and stable  Level of consciousness: sedated  Complications: No apparent anesthesia complications

## 2014-03-26 NOTE — Anesthesia Procedure Notes (Addendum)
Anesthesia Regional Block:  Adductor canal block  Pre-Anesthetic Checklist: ,, timeout performed, Correct Patient, Correct Site, Correct Laterality, Correct Procedure, Correct Position, site marked, Risks and benefits discussed,  Surgical consent,  Pre-op evaluation,  At surgeon's request and post-op pain management  Laterality: Right  Prep: chloraprep       Needles:  Injection technique: Single-shot  Needle Type: Echogenic Stimulator Needle     Needle Length: 9cm 9 cm Needle Gauge: 21 and 21 G    Additional Needles:  Procedures: ultrasound guided (picture in chart) Adductor canal block Narrative:  Start time: 03/26/2014 9:30 AM End time: 03/26/2014 9:40 AM Injection made incrementally with aspirations every 5 mL.  Performed by: Personally  Anesthesiologist: Arta Bruce MD  Additional Notes: Monitors applied. Patient sedated. Sterile prep and drape,hand hygiene and sterile gloves were used. Relevant anatomy identified.Needle position confirmed.Local anesthetic injected incrementally after negative aspiration. Local anesthetic spread visualized around nerve(s). Vascular puncture avoided. No complications. Image printed for medical record.The patient tolerated the procedure well.    Arta Bruce MD   Procedure Name: LMA Insertion Date/Time: 03/26/2014 10:31 AM Performed by: Wray Kearns A Pre-anesthesia Checklist: Patient identified, Timeout performed, Emergency Drugs available, Suction available and Patient being monitored Patient Re-evaluated:Patient Re-evaluated prior to inductionOxygen Delivery Method: Circle system utilized Preoxygenation: Pre-oxygenation with 100% oxygen Intubation Type: IV induction Ventilation: Mask ventilation without difficulty LMA: LMA inserted LMA Size: 4.0 Tube type: Oral Number of attempts: 1 Placement Confirmation: breath sounds checked- equal and bilateral and positive ETCO2 Tube secured with: Tape Dental Injury: Teeth and Oropharynx as per  pre-operative assessment

## 2014-03-26 NOTE — H&P (Signed)
  The recent History & Physical has been reviewed. I have personally examined the patient today. There is no interval change to the documented History & Physical. The patient would like to proceed with the procedure.  Valeria Batman 03/26/2014,  9:59 AM

## 2014-03-26 NOTE — Transfer of Care (Signed)
Immediate Anesthesia Transfer of Care Note  Patient: [name]  Procedure(s) Performed: Procedure(s): KNEE ARTHROSCOPY WITH MEDIAL MENISECTOMY, POSSIBLE CHONDROPLASTY. (Right)  Patient Location: PACU  Anesthesia Type:GA combined with regional for post-op pain  Level of Consciousness: oriented, sedated, patient cooperative and responds to stimulation  Airway & Oxygen Therapy: Patient Spontanous Breathing and Patient connected to nasal cannula oxygen  Post-op Assessment: Report given to PACU RN, Post -op Vital signs reviewed and stable, Patient moving all extremities and Patient moving all extremities X 4  Post vital signs: Reviewed and stable  Complications: No apparent anesthesia complications

## 2014-03-26 NOTE — H&P (Signed)
  PATIENT ID:      Deyvion Dundee  MRN:     [id number] DOB/AGE:    October 11, 1946 / 68 y.o.       OPERATIVE REPORT    DATE OF PROCEDURE:  03/26/2014       PREOPERATIVE DIAGNOSIS:   RIGHT KNEE MEDIAL MENISCAL TEAR, TRICOMPARTMENTAL DJD.                                                       Estimated body mass index is 32.18 kg/(m^2) as calculated from the following:   Height as of this encounter: 5\' 8"  (1.727 m).   Weight as of this encounter: 95.992 kg (211 lb 10 oz).     POSTOPERATIVE DIAGNOSIS:   RIGHT KNEE MEDIAL MENISCAL TEAR, TRICOMPARTMENTAL DJD.                                                                     Estimated body mass index is 32.18 kg/(m^2) as calculated from the following:   Height as of this encounter: 5\' 8"  (1.727 m).   Weight as of this encounter: 95.992 kg (211 lb 10 oz).     PROCEDURE:  Procedure(s): KNEE ARTHROSCOPY WITH MEDIAL MENISECTOMY,CHONDROPLASTY MEDIAL, LATERAL AND PATELLO-FEMORAL JOINTS     SURGEON:  Norlene Campbell, MD    ASSISTANT:   Jacqualine Code, PA-C   (Present and scrubbed throughout the case, critical for assistance with exposure, retraction, instrumentation, and closure.)          ANESTHESIA: regional and general     DRAINS: none :      TOURNIQUET TIME: * No tourniquets in log *    COMPLICATIONS:  None   CONDITION:  stable  PROCEDURE: [id number]  Valeria Batman 03/26/2014, 11:22 AM

## 2014-03-27 NOTE — Op Note (Signed)
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  PROCEDURES: 1. Diagnostic arthroscopy, right knee. 2. Partial medial meniscectomy. 3. Chondroplasty of medial and lateral compartments and patellofemoral     joint.  SURGEON:  [name], M.D.  ASSISTANT:  Marvin Edelman D. Petrarca, PA-C  ANESTHESIA:  General with supplemental femoral nerve block.  COMPLICATIONS:  None.  HISTORY:  A 68 year old gentleman is experiencing right knee pain for well over a year.  There was no history of injury or trauma.  He has had a lot of grinding and grating, recurrent effusions, and pain localized along the medial compartment.  Despite conservative treatment, he had persistent pain to the point of compromise.  Accordingly, an MRI scan was performed, revealing a complex tear of the posterior horn and body of the medial meniscus associated with tricompartmental degenerative changes and now wishes to proceed with a knee arthroscopy.  DESCRIPTION OF PROCEDURE:  [name] was met in the holding area, identified the right knee as appropriate operative site.  Anesthesia performed a femoral nerve block.  The patient was then transported to room #7 and placed under general anesthesia without difficulty.  A time- out was called.  The right lower extremity was placed in a thigh holder.  The leg was then prepped with chlorhexidine scrub and DuraPrep from the thigh holder to the ankle.  Sterile draping was  performed.  Diagnostic arthroscopy was performed using a medial and lateral parapatellar tendon puncture sites.  Arthroscope was placed through the lateral portal.  Diagnostic arthroscopy revealed diffuse beefy red synovitis in all three compartments.  There was chondromalacia of the patella and corresponding trochlea with loose articular cartilage.  Cuda shaver was introduced to remove any loose articular cartilage.  Partial synovectomy was performed with the ArthroCare wand.  There was synovitis in both gutters, but no loose material.  There were osteophytes along the medial and lateral femoral condyles at the patellofemoral joint.  If there was any impingement, these were shaved.  In the lateral compartment, there was a large area of chondromalacia of the femoral condyle with loose articular cartilage.  This was debrided with the Cuda shaver and then tapered, so there was no loose articular cartilage.  The lateral meniscus appeared to be intact.  Synovitis was debrided with the ArthroCare wand.  There were osteophytes within the intercondylar notch impinging the ACL. These were removed with a Cuda shaver and again any synovitis was removed with the ArthroCare wand.  Otherwise, the PCL and ACL appeared to be intact.  In the medial compartment, there was significant pathology, there are areas of grade 4 chondromalacia on both sides of the joint, i.e., femoral condyle and tibial plateau, particularly posteriorly.  There was a complex  tear of the medial meniscus from its midportion to the posterior horn and meniscal root.  The meniscal tearing was the combination of the radial tears and horizontal cleavage tears.  These were debrided with a basket forceps, the Cuda shaver, and the ArthroCare wand.  I then carefully probed the remaining meniscus and it was intact. Any loose articular cartilage was debrided with the Cuda shaver.  The joint was then explored with evidence of loose  material.  Puncture sites and joint were infiltrated with 0.25% Marcaine with epinephrine. Sterile bulky dressing was applied followed by an Ace bandage.  PLAN:  Oxycodone for pain.  Office, 1 week.     [name], M.D.     PWW/MEDQ  D:  03/26/2014  T:  03/27/2014  Job:  [id number]

## 2014-03-29 ENCOUNTER — Encounter (HOSPITAL_COMMUNITY): Payer: Self-pay | Admitting: Orthopaedic Surgery

## 2015-11-11 ENCOUNTER — Encounter (HOSPITAL_COMMUNITY): Payer: Self-pay | Admitting: Emergency Medicine

## 2015-11-11 ENCOUNTER — Emergency Department (HOSPITAL_COMMUNITY)
Admission: EM | Admit: 2015-11-11 | Discharge: 2015-11-11 | Disposition: A | Discharge status: Home / Self Care | Payer: Medicare Other | Source: Home / Self Care | Attending: Emergency Medicine | Admitting: Emergency Medicine

## 2015-11-11 DIAGNOSIS — J069 Acute upper respiratory infection, unspecified: Secondary | ICD-10-CM | POA: Diagnosis not present

## 2015-11-11 DIAGNOSIS — J019 Acute sinusitis, unspecified: Secondary | ICD-10-CM

## 2015-11-11 MED ORDER — IPRATROPIUM BROMIDE 0.06 % NA SOLN: 2.0000 | Freq: Four times a day (QID) | NASAL | Status: DC

## 2015-11-11 NOTE — Discharge Instructions (Signed)
Upper Respiratory Infection, Adult For nasal congestion continue to use Sudafed PE 10 mg every 4 hours as needed. Use copious amounts of saline nasal spray frequently. At nighttime  may use Afrin nasal spray to help breathing while sleeping. Do not use this more than 3 days consecutively.   Atrovent nasal spray as directed For drainage may use Allegra, Zyrtec or Claritin. For additional assistance with drainage may take Chlor-Trimeton 2-4 mg every 4 hours as needed. This may cause drowsiness. His best to take it before bedtime or while at home. Drink plenty of fluids and stay well-hydrated. Dextromethorphan or Delsym as needed for cough     Upper respiratory infections (URIs) are a viral infection of the air passages leading to the lungs. A URI affects the nose, throat, and upper air passages. The most common type of URI is nasopharyngitis and is typically referred to as "the common cold." URIs run their course and usually go away on their own. Most of the time, a URI does not require medical attention, but sometimes a bacterial infection in the upper airways can follow a viral infection. This is called a secondary infection. Sinus and middle ear infections are common types of secondary upper respiratory infections. Bacterial pneumonia can also complicate a URI. A URI can worsen asthma and chronic obstructive pulmonary disease (COPD). Sometimes, these complications can require emergency medical care and may be life threatening.  CAUSES Almost all URIs are caused by viruses. A virus is a type of germ and can spread from one person to another.  RISKS FACTORS You may be at risk for a URI if:   You smoke.   You have chronic heart or lung disease.  You have a weakened defense (immune) system.   You are very young or very old.   You have nasal allergies or asthma.  You work in crowded or poorly ventilated areas.  You work in health care facilities or schools. SIGNS AND SYMPTOMS    Symptoms typically develop 2-3 days after you come in contact with a cold virus. Most viral URIs last 7-10 days. However, viral URIs from the influenza virus (flu virus) can last 14-18 days and are typically more severe. Symptoms may include:   Runny or stuffy (congested) nose.   Sneezing.   Cough.   Sore throat.   Headache.   Fatigue.   Fever.   Loss of appetite.   Pain in your forehead, behind your eyes, and over your cheekbones (sinus pain).  Muscle aches.  DIAGNOSIS  Your health care provider may diagnose a URI by:  Physical exam.  Tests to check that your symptoms are not due to another condition such as:  Strep throat.  Sinusitis.  Pneumonia.  Asthma. TREATMENT  A URI goes away on its own with time. It cannot be cured with medicines, but medicines may be prescribed or recommended to relieve symptoms. Medicines may help:  Reduce your fever.  Reduce your cough.  Relieve nasal congestion. HOME CARE INSTRUCTIONS   Take medicines only as directed by your health care provider.   Gargle warm saltwater or take cough drops to comfort your throat as directed by your health care provider.  Use a warm mist humidifier or inhale steam from a shower to increase air moisture. This may make it easier to breathe.  Drink enough fluid to keep your urine clear or pale yellow.   Eat soups and other clear broths and maintain good nutrition.   Rest as needed.   Return  to work when your temperature has returned to normal or as your health care provider advises. You may need to stay home longer to avoid infecting others. You can also use a face mask and careful hand washing to prevent spread of the virus.  Increase the usage of your inhaler if you have asthma.   Do not use any tobacco products, including cigarettes, chewing tobacco, or electronic cigarettes. If you need help quitting, ask your health care provider. PREVENTION  The best way to protect  yourself from getting a cold is to practice good hygiene.   Avoid oral or hand contact with people with cold symptoms.   Wash your hands often if contact occurs.  There is no clear evidence that vitamin C, vitamin E, echinacea, or exercise reduces the chance of developing a cold. However, it is always recommended to get plenty of rest, exercise, and practice good nutrition.  SEEK MEDICAL CARE IF:   You are getting worse rather than better.   Your symptoms are not controlled by medicine.   You have chills.  You have worsening shortness of breath.  You have brown or red mucus.  You have yellow or brown nasal discharge.  You have pain in your face, especially when you bend forward.  You have a fever.  You have swollen neck glands.  You have pain while swallowing.  You have white areas in the back of your throat. SEEK IMMEDIATE MEDICAL CARE IF:   You have severe or persistent:  Headache.  Ear pain.  Sinus pain.  Chest pain.  You have chronic lung disease and any of the following:  Wheezing.  Prolonged cough.  Coughing up blood.  A change in your usual mucus.  You have a stiff neck.  You have changes in your:  Vision.  Hearing.  Thinking.  Mood. MAKE SURE YOU:   Understand these instructions.  Will watch your condition.  Will get help right away if you are not doing well or get worse.   This information is not intended to replace advice given to you by your health care provider. Make sure you discuss any questions you have with your health care provider.   Document Released: 04/06/2001 Document Revised: 02/25/2015 Document Reviewed: 01/16/2014 Elsevier Interactive Patient Education 2016 Else[employer] Inc[employer]  Sinusitis, Adult Sinusitis is redness, soreness, and puffiness (inflammation) of the air pockets in the bones of your face (sinuses). The redness, soreness, and puffiness can cause air and mucus to get trapped in your sinuses. This can allow  germs to grow and cause an infection.  HOME CARE   Drink enough fluids to keep your pee (urine) clear or pale yellow.  Use a humidifier in your home.  Run a hot shower to create steam in the bathroom. Sit in the bathroom with the door closed. Breathe in the steam 3-4 times a day.  Put a warm, moist washcloth on your face 3-4 times a day, or as told by your doctor.  Use salt water sprays (saline sprays) to wet the thick fluid in your nose. This can help the sinuses drain.  Only take medicine as told by your doctor. GET HELP RIGHT AWAY IF:   Your pain gets worse.  You have very bad headaches.  You are sick to your stomach (nauseous).  You throw up (vomit).  You are very sleepy (drowsy) all the time.  Your face is puffy (swollen).  Your vision changes.  You have a stiff neck.  You have trouble breathing.  MAKE SURE YOU:   Understand these instructions.  Will watch your condition.  Will get help right away if you are not doing well or get worse.   This information is not intended to replace advice given to you by your health care provider. Make sure you discuss any questions you have with your health care provider.   Document Released: 03/29/2008 Document Revised: 11/01/2014 Document Reviewed: 05/16/2012 Elsevier Interactive Patient Education 2016 Elsevier Inc.  Sinus Rinse WHAT IS A SINUS RINSE? A sinus rinse is a home treatment. It rinses your sinuses with a mixture of salt and water (saline solution). Sinuses are air-filled spaces in your skull behind the bones of your face and forehead. They open into your nasal cavity. To do a sinus rinse, you will need:  Saline solution.  Neti pot or spray bottle. This releases the saline solution into your nose and through your sinuses. You can buy neti pots and spray bottles at:  Your local pharmacy.  A health food store.  Online. WHEN WOULD I DO A SINUS RINSE?  A sinus rinse can help to clear your nasal cavity. It can  clear:   Mucus.  Dirt.  Dust.  Pollen. You may do a sinus rinse when you have:  A cold.  A virus.  Allergies.  A sinus infection.  A stuffy nose. If you are considering a sinus rinse:  Ask your child's doctor before doing a sinus rinse on your child.  Do not do a sinus rinse if you have had:  Ear or nasal surgery.  An ear infection.  Blocked ears. HOW DO I DO A SINUS RINSE?   Wash your hands.  Disinfect your device using the directions that came with the device.  Dry your device.  Use the solution that comes with your device or one that is sold separately in stores. Follow the mixing directions on the package.  Fill your device with the amount of saline solution as stated in the device instructions.  Stand over a sink and tilt your head sideways over the sink.  Place the spout of the device in your upper nostril (the one closer to the ceiling).  Gently pour or squeeze the saline solution into the nasal cavity. The liquid should drain to the lower nostril if you are not too congested.  Gently blow your nose. Blowing too hard may cause ear pain.  Repeat in the other nostril.  Clean and rinse your device with clean water.  Air-dry your device. ARE THERE RISKS OF A SINUS RINSE?  Sinus rinse is normally very safe and helpful. However, there are a few risks, which include:   A burning feeling in the sinuses. This may happen if you do not make the saline solution as instructed. Make sure to follow all directions when making the saline solution.  Infection from unclean water. This is rare, but possible.  Nasal irritation.   This information is not intended to replace advice given to you by your health care provider. Make sure you discuss any questions you have with your health care provider.   Document Released: 05/08/2014 Document Reviewed: 05/08/2014 Elsevier Interactive Patient Education 2016 Elsevier Inc.  Sinus Rinse WHAT IS A SINUS RINSE? A sinus  rinse is a home treatment. It rinses your sinuses with a mixture of salt and water (saline solution). Sinuses are air-filled spaces in your skull behind the bones of your face and forehead. They open into your nasal cavity. To do a sinus rinse, you  will need:  Saline solution.  Neti pot or spray bottle. This releases the saline solution into your nose and through your sinuses. You can buy neti pots and spray bottles at:  Your local pharmacy.  A health food store.  Online. WHEN WOULD I DO A SINUS RINSE?  A sinus rinse can help to clear your nasal cavity. It can clear:   Mucus.  Dirt.  Dust.  Pollen. You may do a sinus rinse when you have:  A cold.  A virus.  Allergies.  A sinus infection.  A stuffy nose. If you are considering a sinus rinse:  Ask your child's doctor before doing a sinus rinse on your child.  Do not do a sinus rinse if you have had:  Ear or nasal surgery.  An ear infection.  Blocked ears. HOW DO I DO A SINUS RINSE?   Wash your hands.  Disinfect your device using the directions that came with the device.  Dry your device.  Use the solution that comes with your device or one that is sold separately in stores. Follow the mixing directions on the package.  Fill your device with the amount of saline solution as stated in the device instructions.  Stand over a sink and tilt your head sideways over the sink.  Place the spout of the device in your upper nostril (the one closer to the ceiling).  Gently pour or squeeze the saline solution into the nasal cavity. The liquid should drain to the lower nostril if you are not too congested.  Gently blow your nose. Blowing too hard may cause ear pain.  Repeat in the other nostril.  Clean and rinse your device with clean water.  Air-dry your device. ARE THERE RISKS OF A SINUS RINSE?  Sinus rinse is normally very safe and helpful. However, there are a few risks, which include:   A burning feeling in  the sinuses. This may happen if you do not make the saline solution as instructed. Make sure to follow all directions when making the saline solution.  Infection from unclean water. This is rare, but possible.  Nasal irritation.   This information is not intended to replace advice given to you by your health care provider. Make sure you discuss any questions you have with your health care provider.   Document Released: 05/08/2014 Document Reviewed: 05/08/2014 Elsevier Interactive Patient Education [employer]

## 2015-11-11 NOTE — ED Notes (Signed)
Patient complains of sinus issues.  Onset 11/03/2015

## 2015-11-11 NOTE — ED Provider Notes (Signed)
CSN: [id number]     Arrival date & time 11/11/15  1556 History   First MD Initiated Contact with Patient 11/11/15 1710     No chief complaint on file.  (Consider location/radiation/quality/duration/timing/severity/associated sxs/prior Treatment) HPI Comments: 70 year old male complaining of head congestion, nasal congestion, PND, cough that is worse when supine and occasional chills. Denies fever or earache. Occasionally he will feel transient sore throat. The symptoms began approximately 8-9 days ago.   Past Medical History  Diagnosis Date  . Hypertension   . GERD (gastroesophageal reflux disease)   . H/O hiatal hernia    Past Surgical History  Procedure Laterality Date  . Rotator cuff repair    . Joint replacement      scope  . Hernia repair    . Knee arthroscopy with medial menisectomy Right 03/26/2014    Procedure: KNEE ARTHROSCOPY WITH MEDIAL MENISECTOMY, POSSIBLE CHONDROPLASTY.;  Surgeon: Valeria Batman, MD;  Location: [facility] OR;  Service: Orthopedics;  Laterality: Right;   No family history on file. Social History  Substance Use Topics  . Smoking status: Former Games developer  . Smokeless tobacco: None  . Alcohol Use: Yes     Comment: daily    Review of Systems  Constitutional: Positive for activity change. Negative for fever, diaphoresis and fatigue.  HENT: Positive for congestion, postnasal drip, rhinorrhea and sore throat. Negative for ear pain, facial swelling and trouble swallowing.   Eyes: Negative for pain, discharge and redness.  Respiratory: Positive for cough. Negative for chest tightness and shortness of breath.   Cardiovascular: Negative.   Gastrointestinal: Negative.   Musculoskeletal: Negative.  Negative for neck pain and neck stiffness.  Neurological: Negative.     Allergies  Review of patient's allergies indicates no known allergies.  Home Medications   Prior to Admission medications   Medication Sig Start Date End Date Taking? Authorizing Provider   amLODipine-olmesartan (AZOR) 10-40 MG per tablet Take 1 tablet by mouth daily.    Historical Provider, MD  esomeprazole (NEXIUM) 40 MG capsule Take 40 mg by mouth daily as needed (for reflux).    Historical Provider, MD  ipratropium (ATROVENT) 0.06 % nasal spray Place 2 sprays into both nostrils 4 (four) times daily. 11/11/15   Hayden Rasmussen, NP  metoprolol succinate (TOPROL-XL) 25 MG 24 hr tablet Take 25 mg by mouth daily.    Historical Provider, MD  oxyCODONE-acetaminophen (ROXICET) 5-325 MG per tablet Take 1-2 tablets by mouth every 4 (four) hours as needed for severe pain. 03/26/14   Valeria Batman, MD  polyvinyl alcohol (LIQUIFILM TEARS) 1.4 % ophthalmic solution Place 1 drop into both eyes daily as needed for dry eyes.    Historical Provider, MD   Meds Ordered and Administered this Visit  Medications - No data to display  BP 140/89 mmHg  Pulse 73  Temp(Src) 98.5 F (36.9 C) (Oral)  Resp 16  SpO2 97% No data found.   Physical Exam  Constitutional: He is oriented to person, place, and time. He appears well-developed and well-nourished. No distress.  HENT:  Mouth/Throat: No oropharyngeal exudate.  Bilateral TMs are normal Oropharynx with moderate erythema, clear PND and cobblestoning. No exudates.  Eyes: Conjunctivae and EOM are normal.  Neck: Normal range of motion. Neck supple.  Cardiovascular: Normal rate, regular rhythm and normal heart sounds.   Pulmonary/Chest: Effort normal and breath sounds normal. No respiratory distress. He has no wheezes. He has no rales.  Musculoskeletal: Normal range of motion. He exhibits no edema.  Lymphadenopathy:  He has no cervical adenopathy.  Neurological: He is alert and oriented to person, place, and time.  Skin: Skin is warm and dry. No rash noted.  Psychiatric: He has a normal mood and affect.  Nursing note and vitals reviewed.   ED Course  Procedures (including critical care time)  Labs Review Labs Reviewed - No data to  display  Imaging Review No results found.   Visual Acuity Review  Right Eye Distance:   Left Eye Distance:   Bilateral Distance:    Right Eye Near:   Left Eye Near:    Bilateral Near:         MDM   1. URI (upper respiratory infection)   2. Acute rhinosinusitis    Upper Respiratory Infection, Adult For nasal congestion continue to use Sudafed PE 10 mg every 4 hours as needed. Use copious amounts of saline nasal spray frequently. At nighttime  may use Afrin nasal spray to help breathing while sleeping. Do not use this more than 3 days consecutively.   Atrovent nasal spray as directed For drainage may use Allegra, Zyrtec or Claritin. For additional assistance with drainage may take Chlor-Trimeton 2-4 mg every 4 hours as needed. This may cause drowsiness. His best to take it before bedtime or while at home. Drink plenty of fluids and stay well-hydrated. Dextromethorphan or Delsym as needed for cough    Hayden Rasmussen, NP 11/11/15 1739

## 2016-01-08 ENCOUNTER — Other Ambulatory Visit: Payer: Self-pay | Admitting: Internal Medicine

## 2016-01-08 DIAGNOSIS — R945 Abnormal results of liver function studies: Secondary | ICD-10-CM

## 2016-01-16 ENCOUNTER — Ambulatory Visit
Admission: RE | Admit: 2016-01-16 | Discharge: 2016-01-16 | Disposition: A | Discharge status: Home / Self Care | Payer: Medicare Other | Source: Ambulatory Visit | Attending: Internal Medicine | Admitting: Internal Medicine

## 2016-01-16 DIAGNOSIS — R945 Abnormal results of liver function studies: Secondary | ICD-10-CM

## 2016-07-31 IMAGING — US US ABDOMEN COMPLETE
1 series · 14 of 25 positions shown · non-contrast
Comparison: 02/20/2009 CT

CLINICAL DATA: Abnormal liver function tests

EXAM:
ABDOMEN ULTRASOUND COMPLETE

[Series 1: us abdomen complete · 0.32mm/px · 14 of 100 slices shown]
[im 1/100]
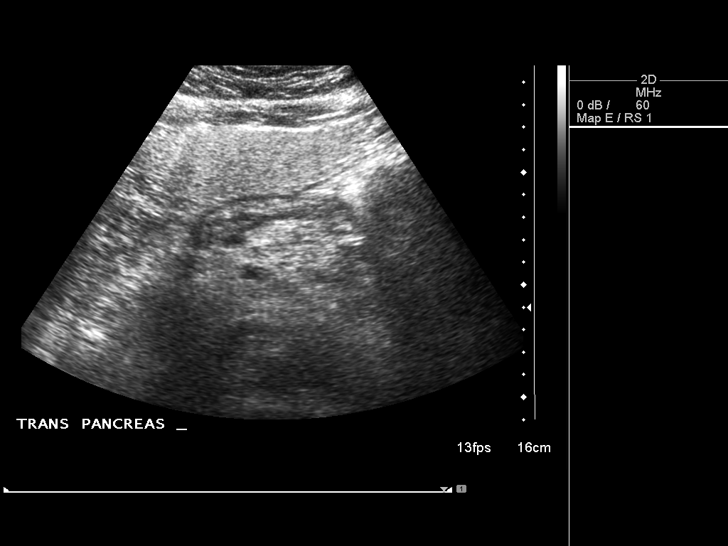
[im 9/100]
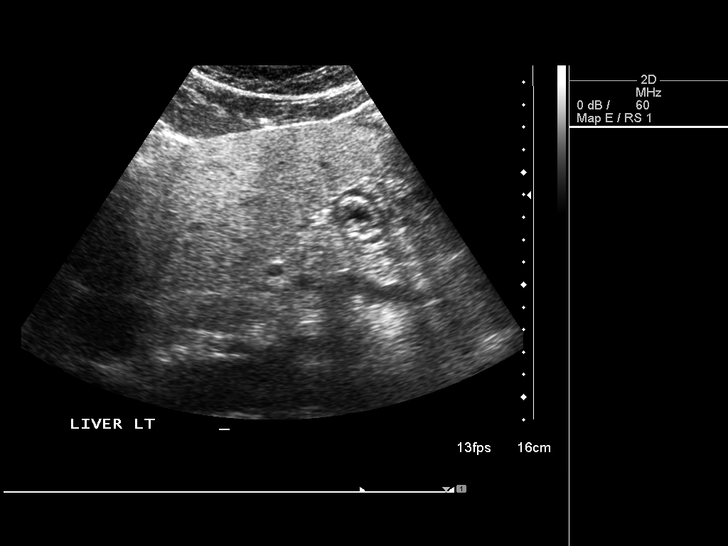
[im 17/100]
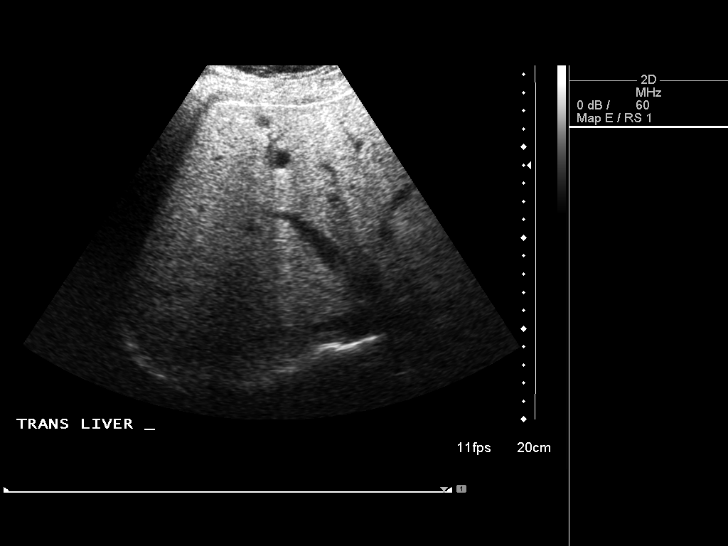
[im 25/100]
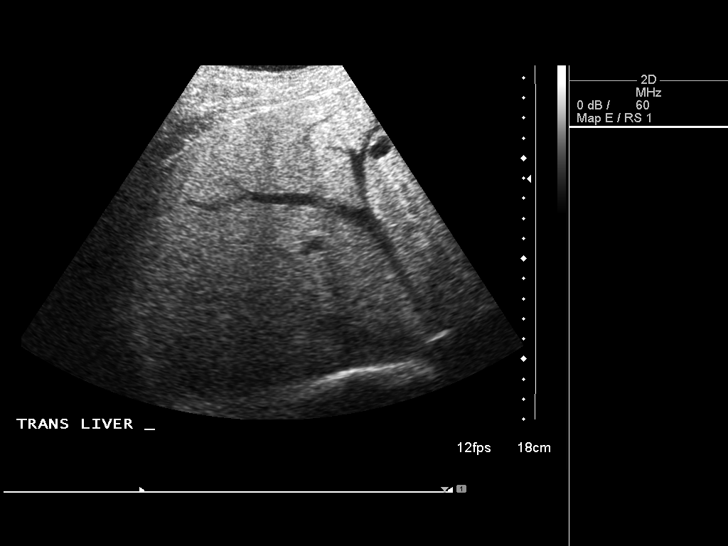
[im 34/100]
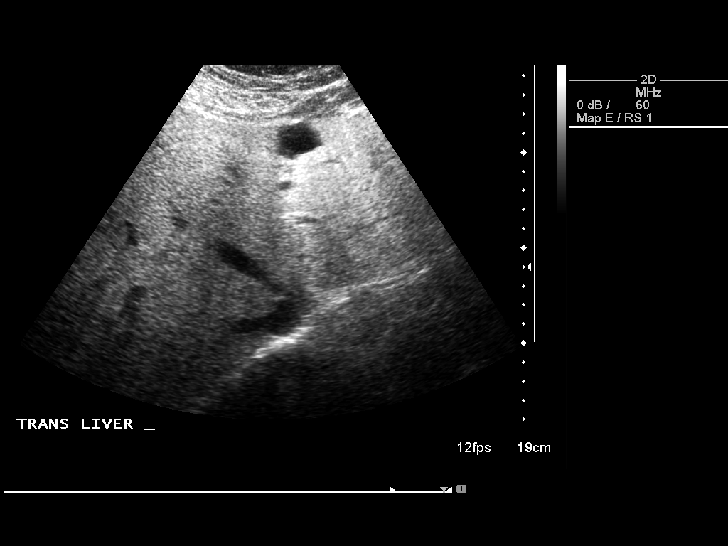
[im 38/100]
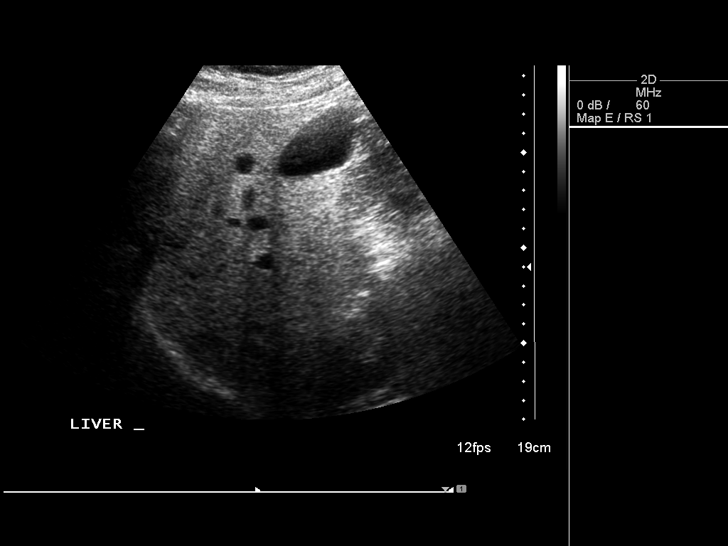
[im 46/100]
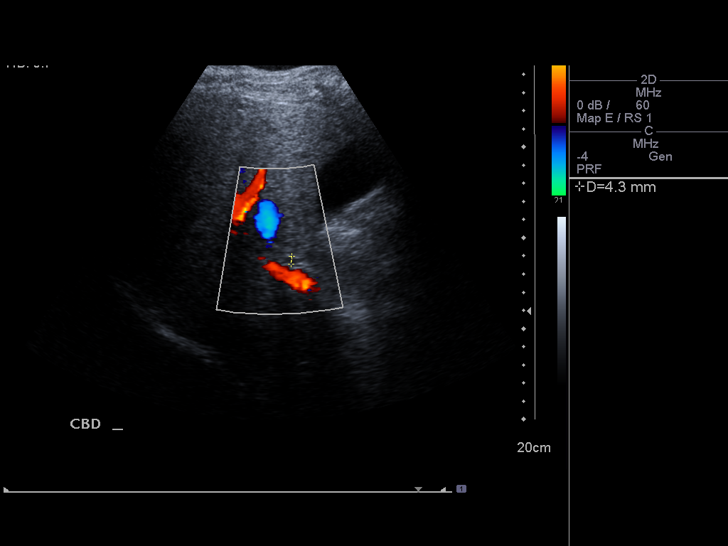
[im 54/100]
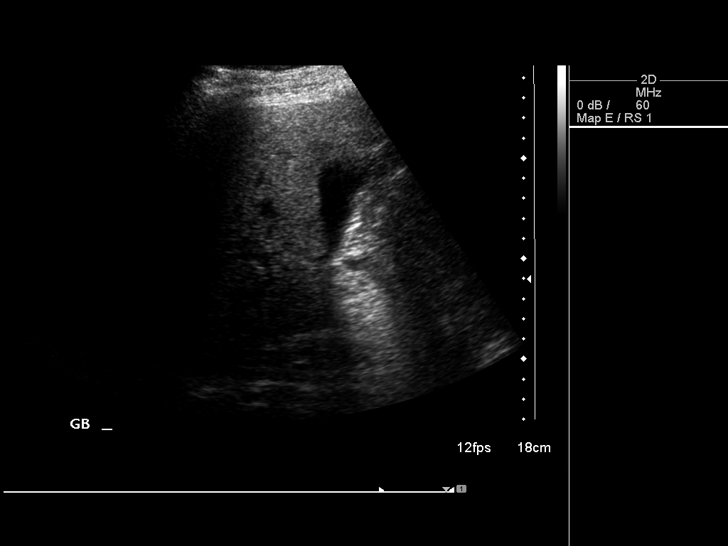
[im 62/100]
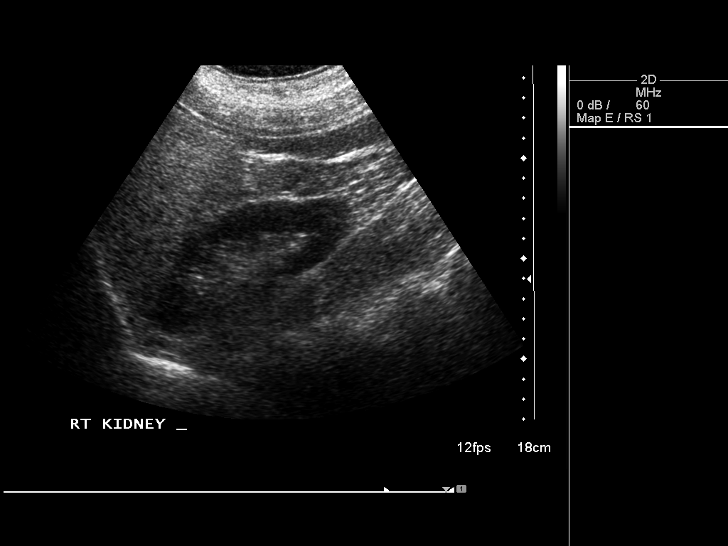
[im 67/100]
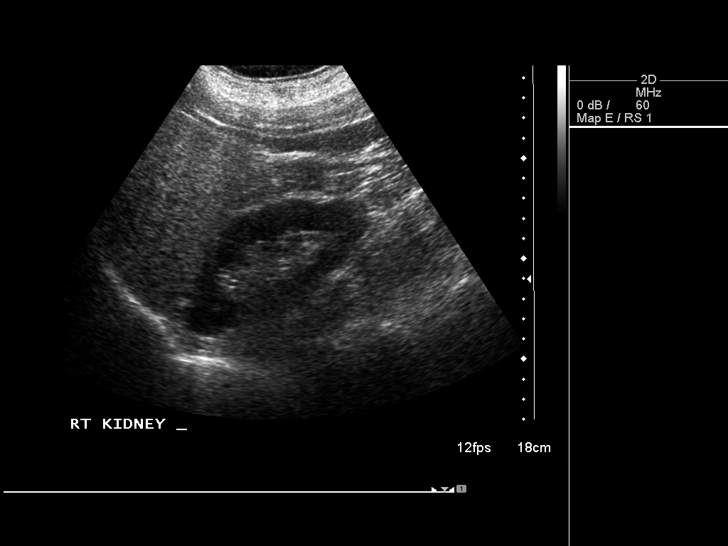
[im 75/100]
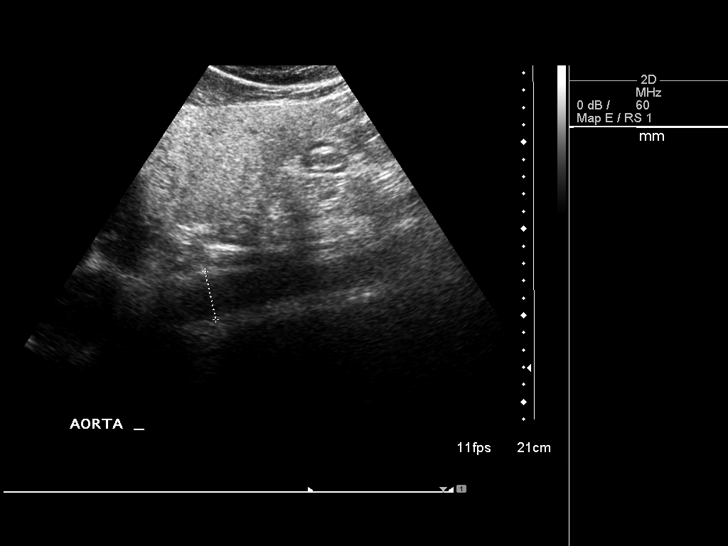
[im 83/100]
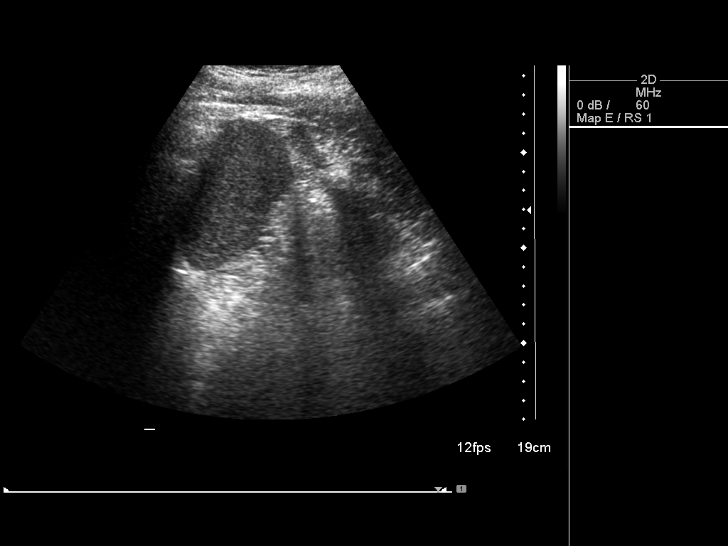
[im 91/100]
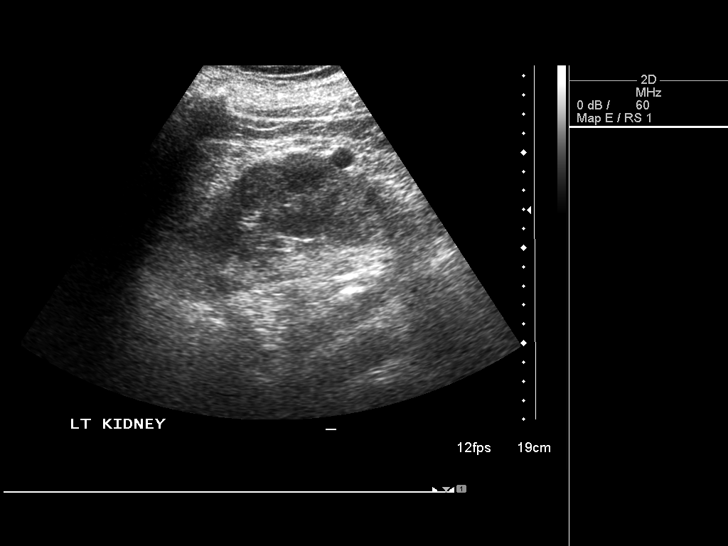
[im 100/100]
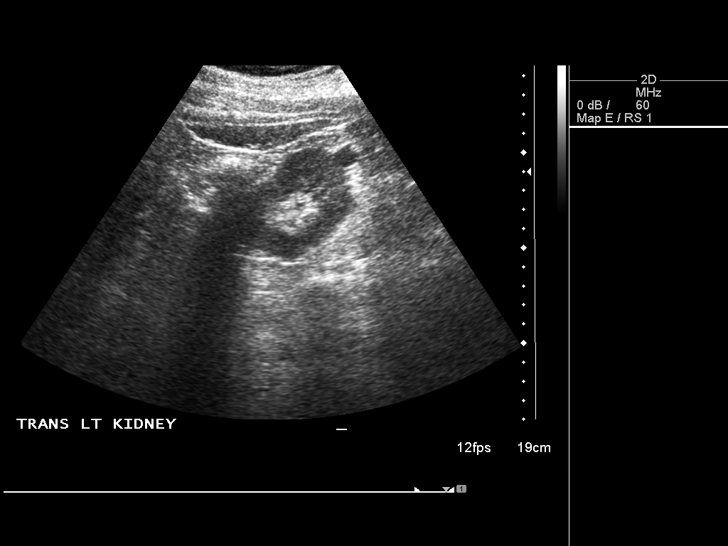

[14 of 25 positions shown; findings below may reference images not displayed]

FINDINGS: Gallbladder: No gallstones or wall thickening visualized. No
sonographic Murphy sign noted by sonographer.

Common bile duct: Diameter: 4 mm.

Liver: Increased echogenicity throughout the liver. Multiple cysts
are not significantly changed compared with prior imaging. The
largest is in the left lobe measuring 3.2 cm. It remains lobulated.

IVC: No abnormality visualized.

Pancreas: Visualized portion unremarkable.

Spleen: Size and appearance within normal limits.

Right Kidney: Length: 11.5 cm. Echogenicity within normal limits. No
mass or hydronephrosis visualized.

Left Kidney: Length: 4.4 cm. Echogenicity within normal limits. No
mass or hydronephrosis visualized. There is a simple cyst in the
lower pole measuring 1.4 cm

Abdominal aorta: No aneurysm visualized.

Other findings: None.
IMPRESSION: Diffuse hepatic steatosis.

Benign appearing cysts in the liver and left kidney.

## 2017-06-21 ENCOUNTER — Ambulatory Visit (HOSPITAL_COMMUNITY)
Admission: EM | Admit: 2017-06-21 | Discharge: 2017-06-21 | Disposition: A | Discharge status: Home / Self Care | Payer: Medicare Other | Attending: Emergency Medicine | Admitting: Emergency Medicine

## 2017-06-21 ENCOUNTER — Encounter (HOSPITAL_COMMUNITY): Payer: Self-pay | Admitting: Family Medicine

## 2017-06-21 DIAGNOSIS — S0990XA Unspecified injury of head, initial encounter: Secondary | ICD-10-CM | POA: Diagnosis not present

## 2017-06-21 MED ORDER — IBUPROFEN 800 MG PO TABS: 800.0000 mg | ORAL_TABLET | Freq: Three times a day (TID) | ORAL | 0 refills | Status: DC

## 2017-06-21 NOTE — Discharge Instructions (Signed)
Your symptoms are consistent with a minor head injury, such as a concussion. Recommend rest in a cool, quiet environment, avoid strenuous physical activity, and loud noises. For pain I prescribed ibuprofen 800 mg he can have one tablet every 8 hours. This will also help with musculoskeletal pains as well. If at any time you develop unilateral weakness, worsening headache, blurred vision, or other issues, go to the ER

## 2017-06-21 NOTE — ED Triage Notes (Signed)
Pt here for head injury that occurred Sunday night. sts that he was hi tin the head and now having pain on right side if head. Denies dizziness, vision problems. sts also left arm pain.
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
## 2020-09-13 ENCOUNTER — Other Ambulatory Visit: Payer: Self-pay

## 2020-09-13 ENCOUNTER — Ambulatory Visit (HOSPITAL_COMMUNITY)
Admission: EM | Admit: 2020-09-13 | Discharge: 2020-09-13 | Disposition: A | Discharge status: Home / Self Care | Payer: Medicare Other | Attending: Family Medicine | Admitting: Family Medicine

## 2020-09-13 ENCOUNTER — Encounter (HOSPITAL_COMMUNITY): Payer: Self-pay | Admitting: Emergency Medicine

## 2020-09-13 DIAGNOSIS — R350 Frequency of micturition: Secondary | ICD-10-CM

## 2020-09-13 DIAGNOSIS — N3090 Cystitis, unspecified without hematuria: Secondary | ICD-10-CM

## 2020-09-13 LAB — POCT URINALYSIS DIPSTICK, ED / UC
Glucose, UA: NEGATIVE mg/dL
Ketones, ur: 15 mg/dL — AB
Nitrite: POSITIVE — AB
Protein, ur: 100 mg/dL — AB
Specific Gravity, Urine: >=1.03 (ref 1.005–1.030)
Urobilinogen, UA: 1 mg/dL (ref 0.0–1.0)
pH: 5 (ref 5.0–8.0)

## 2020-09-13 MED ORDER — CEPHALEXIN 500 MG PO CAPS: 500.0000 mg | ORAL_CAPSULE | Freq: Two times a day (BID) | ORAL | 0 refills | Status: DC

## 2020-09-13 NOTE — ED Triage Notes (Signed)
Pt c/o urinary frequency onset Thursday. Pt states urine is dark and has a slight odor. Pt states he has some pain with urination.

## 2020-09-13 NOTE — ED Provider Notes (Signed)
[facility]    ASSESSMENT & PLAN:  1. Urinary frequency   2. Cystitis     Begin: Meds ordered this encounter  Medications  . cephALEXin (KEFLEX) 500 MG capsule    Sig: Take 1 capsule (500 mg total) by mouth 2 (two) times daily.    Dispense:  14 capsule    Refill:  0   No signs of pyelonephritis. Discussed. Urine culture sent. Will notify patient when results available. Will follow up with his PCP or here if not showing improvement over the next few days, sooner if needed.  Outlined signs and symptoms indicating need for more acute intervention. Patient verbalized understanding. After Visit Summary given.  SUBJECTIVE:  [name] is a 74 y.o. male who complains of urinary frequency, urgency and dysuria for the past 2-3 d. Without associated flank pain, fever, chills, penile discharge or bleeding. Gross hematuria: not present. No specific aggravating or alleviating factors reported. No LE edema. Normal PO intake without n/v/d. Without specific abdominal pain. Ambulatory without difficulty. OTC treatment: none. Reports no h/o prostate problems.   OBJECTIVE:  Vitals:   09/13/20 1141  BP: (!) 143/93  Pulse: 78  Resp: 16  Temp: 98.5 F (36.9 C)  TempSrc: Oral  SpO2: 100%   General appearance: alert; no distress Lungs: unlabored respirations Abdomen: soft Back: no CVA tenderness reported Skin: warm and dry Neurologic: normal gait Psychological: alert and cooperative; normal mood and affect  Labs Reviewed  POCT URINALYSIS DIPSTICK, ED / UC - Abnormal; Notable for the following components:      Result Value   Bilirubin Urine MODERATE (*)    Ketones, ur 15 (*)    Hgb urine dipstick MODERATE (*)    Protein, ur 100 (*)    Nitrite POSITIVE (*)    Leukocytes,Ua TRACE (*)    All other components within normal limits  URINE CULTURE    No Known Allergies  Past Medical History:  Diagnosis Date  . GERD (gastroesophageal reflux disease)   . H/O hiatal  hernia   . Hypertension    Social History   Socioeconomic History  . Marital status: Married    Spouse name: Not on file  . Number of children: Not on file  . Years of education: Not on file  . Highest education level: Not on file  Occupational History  . Not on file  Tobacco Use  . Smoking status: Former Games developer  . Smokeless tobacco: Never Used  Substance and Sexual Activity  . Alcohol use: Yes    Alcohol/week: 28.0 standard drinks    Types: 14 Cans of beer, 14 Shots of liquor per week    Comment: daily  . Drug use: No  . Sexual activity: Not on file  Other Topics Concern  . Not on file  Social History Narrative  . Not on file   Social Determinants of Health   Financial Resource Strain:   . Difficulty of Paying Living Expenses: Not on file  Food Insecurity:   . Worried About Programme researcher, broadcasting/film/video in the Last Year: Not on file  . Ran Out of Food in the Last Year: Not on file  Transportation Needs:   . Lack of Transportation (Medical): Not on file  . Lack of Transportation (Non-Medical): Not on file  Physical Activity:   . Days of Exercise per Week: Not on file  . Minutes of Exercise per Session: Not on file  Stress:   . Feeling of Stress : Not on  file  Social Connections:   . Frequency of Communication with Friends and Family: Not on file  . Frequency of Social Gatherings with Friends and Family: Not on file  . Attends Religious Services: Not on file  . Active Member of Clubs or Organizations: Not on file  . Attends Banker Meetings: Not on file  . Marital Status: Not on file  Intimate Partner Violence:   . Fear of Current or Ex-Partner: Not on file  . Emotionally Abused: Not on file  . Physically Abused: Not on file  . Sexually Abused: Not on file   History reviewed. No pertinent family history.     Mardella Layman, MD 09/13/20 1155

## 2020-09-14 LAB — URINE CULTURE: Culture: >=100000 — AB

## 2020-09-15 ENCOUNTER — Telehealth (HOSPITAL_COMMUNITY): Payer: Self-pay | Admitting: Emergency Medicine

## 2020-09-15 LAB — URINE CULTURE

## 2020-09-15 MED ORDER — NITROFURANTOIN MONOHYD MACRO 100 MG PO CAPS: 100.0000 mg | ORAL_CAPSULE | Freq: Two times a day (BID) | ORAL | 0 refills | Status: DC

## 2022-10-05 ENCOUNTER — Inpatient Hospital Stay (HOSPITAL_COMMUNITY)
Admission: EM | Disposition: A | Discharge status: Home / Self Care | Payer: Self-pay | Source: Home / Self Care | Attending: Cardiovascular Disease

## 2022-10-05 ENCOUNTER — Other Ambulatory Visit (HOSPITAL_COMMUNITY): Payer: Self-pay

## 2022-10-05 ENCOUNTER — Encounter (HOSPITAL_COMMUNITY): Payer: Self-pay | Admitting: Cardiovascular Disease

## 2022-10-05 ENCOUNTER — Inpatient Hospital Stay (HOSPITAL_COMMUNITY)
Admission: EM | Admit: 2022-10-05 | Discharge: 2022-10-09 | DRG: 322 | Disposition: A | Discharge status: Home / Self Care | Payer: Medicare Other | Attending: Cardiovascular Disease | Admitting: Cardiovascular Disease

## 2022-10-05 DIAGNOSIS — I513 Intracardiac thrombosis, not elsewhere classified: Secondary | ICD-10-CM | POA: Insufficient documentation

## 2022-10-05 DIAGNOSIS — Z8719 Personal history of other diseases of the digestive system: Secondary | ICD-10-CM | POA: Diagnosis not present

## 2022-10-05 DIAGNOSIS — I25111 Atherosclerotic heart disease of native coronary artery with angina pectoris with documented spasm: Secondary | ICD-10-CM | POA: Diagnosis present

## 2022-10-05 DIAGNOSIS — E7841 Elevated Lipoprotein(a): Secondary | ICD-10-CM | POA: Diagnosis present

## 2022-10-05 DIAGNOSIS — I110 Hypertensive heart disease with heart failure: Secondary | ICD-10-CM | POA: Diagnosis present

## 2022-10-05 DIAGNOSIS — I9751 Accidental puncture and laceration of a circulatory system organ or structure during a circulatory system procedure: Secondary | ICD-10-CM | POA: Diagnosis not present

## 2022-10-05 DIAGNOSIS — Y92238 Other place in hospital as the place of occurrence of the external cause: Secondary | ICD-10-CM | POA: Diagnosis not present

## 2022-10-05 DIAGNOSIS — E7800 Pure hypercholesterolemia, unspecified: Secondary | ICD-10-CM | POA: Insufficient documentation

## 2022-10-05 DIAGNOSIS — I255 Ischemic cardiomyopathy: Secondary | ICD-10-CM | POA: Diagnosis present

## 2022-10-05 DIAGNOSIS — Z792 Long term (current) use of antibiotics: Secondary | ICD-10-CM

## 2022-10-05 DIAGNOSIS — Z955 Presence of coronary angioplasty implant and graft: Secondary | ICD-10-CM

## 2022-10-05 DIAGNOSIS — I2102 ST elevation (STEMI) myocardial infarction involving left anterior descending coronary artery: Secondary | ICD-10-CM | POA: Diagnosis present

## 2022-10-05 DIAGNOSIS — N400 Enlarged prostate without lower urinary tract symptoms: Secondary | ICD-10-CM | POA: Diagnosis present

## 2022-10-05 DIAGNOSIS — I10 Essential (primary) hypertension: Secondary | ICD-10-CM | POA: Insufficient documentation

## 2022-10-05 DIAGNOSIS — I5020 Unspecified systolic (congestive) heart failure: Secondary | ICD-10-CM

## 2022-10-05 DIAGNOSIS — Y718 Miscellaneous cardiovascular devices associated with adverse incidents, not elsewhere classified: Secondary | ICD-10-CM | POA: Diagnosis not present

## 2022-10-05 DIAGNOSIS — Y840 Cardiac catheterization as the cause of abnormal reaction of the patient, or of later complication, without mention of misadventure at the time of the procedure: Secondary | ICD-10-CM | POA: Diagnosis not present

## 2022-10-05 DIAGNOSIS — Z538 Procedure and treatment not carried out for other reasons: Secondary | ICD-10-CM | POA: Diagnosis not present

## 2022-10-05 DIAGNOSIS — Z9660 Presence of unspecified orthopedic joint implant: Secondary | ICD-10-CM | POA: Diagnosis present

## 2022-10-05 DIAGNOSIS — K219 Gastro-esophageal reflux disease without esophagitis: Secondary | ICD-10-CM | POA: Diagnosis present

## 2022-10-05 DIAGNOSIS — I2510 Atherosclerotic heart disease of native coronary artery without angina pectoris: Secondary | ICD-10-CM | POA: Diagnosis not present

## 2022-10-05 DIAGNOSIS — Z79899 Other long term (current) drug therapy: Secondary | ICD-10-CM | POA: Diagnosis not present

## 2022-10-05 DIAGNOSIS — Z95810 Presence of automatic (implantable) cardiac defibrillator: Secondary | ICD-10-CM | POA: Insufficient documentation

## 2022-10-05 DIAGNOSIS — I429 Cardiomyopathy, unspecified: Secondary | ICD-10-CM | POA: Diagnosis not present

## 2022-10-05 HISTORY — PX: CORONARY/GRAFT ACUTE MI REVASCULARIZATION: CATH118305

## 2022-10-05 HISTORY — PX: CORONARY STENT INTERVENTION: CATH118234

## 2022-10-05 HISTORY — PX: LEFT HEART CATH AND CORONARY ANGIOGRAPHY: CATH118249

## 2022-10-05 LAB — CBC
HCT: 43.5 % (ref 39.0–52.0)
Hemoglobin: 15.8 g/dL (ref 13.0–17.0)
MCH: 33.9 pg (ref 26.0–34.0)
MCHC: 36.3 g/dL — ABNORMAL HIGH (ref 30.0–36.0)
MCV: 93.3 fL (ref 80.0–100.0)
Platelets: 162 10*3/uL (ref 150–400)
RBC: 4.66 MIL/uL (ref 4.22–5.81)
RDW: 14.2 % (ref 11.5–15.5)
WBC: 9.4 10*3/uL (ref 4.0–10.5)
nRBC: 0 % (ref 0.0–0.2)

## 2022-10-05 LAB — CBC WITH DIFFERENTIAL/PLATELET
Abs Immature Granulocytes: 0.06 10*3/uL (ref 0.00–0.07)
Basophils Absolute: 0.1 10*3/uL (ref 0.0–0.1)
Basophils Relative: 1 %
Eosinophils Absolute: 0.1 10*3/uL (ref 0.0–0.5)
Eosinophils Relative: 1 %
HCT: 42.2 % (ref 39.0–52.0)
Hemoglobin: 15.4 g/dL (ref 13.0–17.0)
Immature Granulocytes: 1 %
Lymphocytes Relative: 19 %
Lymphs Abs: 1.9 10*3/uL (ref 0.7–4.0)
MCH: 33.5 pg (ref 26.0–34.0)
MCHC: 36.5 g/dL — ABNORMAL HIGH (ref 30.0–36.0)
MCV: 91.7 fL (ref 80.0–100.0)
Monocytes Absolute: 0.7 10*3/uL (ref 0.1–1.0)
Monocytes Relative: 6 %
Neutro Abs: 7.5 10*3/uL (ref 1.7–7.7)
Neutrophils Relative %: 72 %
Platelets: 145 10*3/uL — ABNORMAL LOW (ref 150–400)
RBC: 4.6 MIL/uL (ref 4.22–5.81)
RDW: 14.2 % (ref 11.5–15.5)
WBC: 10.3 10*3/uL (ref 4.0–10.5)
nRBC: 0 % (ref 0.0–0.2)

## 2022-10-05 LAB — URINALYSIS, COMPLETE (UACMP) WITH MICROSCOPIC: RBC / HPF: >50 RBC/hpf (ref 0–5)

## 2022-10-05 LAB — POCT I-STAT 7, (LYTES, BLD GAS, ICA,H+H)
Acid-base deficit: 4 mmol/L — ABNORMAL HIGH (ref 0.0–2.0)
Bicarbonate: 20.5 mmol/L (ref 20.0–28.0)
Calcium, Ion: 1.18 mmol/L (ref 1.15–1.40)
HCT: 44 % (ref 39.0–52.0)
Hemoglobin: 15 g/dL (ref 13.0–17.0)
O2 Saturation: 97 %
Potassium: 3.7 mmol/L (ref 3.5–5.1)
Sodium: 139 mmol/L (ref 135–145)
TCO2: 21 mmol/L — ABNORMAL LOW (ref 22–32)
pCO2 arterial: 33.5 mmHg (ref 32–48)
pH, Arterial: 7.394 (ref 7.35–7.45)
pO2, Arterial: 89 mmHg (ref 83–108)

## 2022-10-05 LAB — POCT I-STAT, CHEM 8
BUN: 14 mg/dL (ref 8–23)
Calcium, Ion: 1.17 mmol/L (ref 1.15–1.40)
Chloride: 109 mmol/L (ref 98–111)
Creatinine, Ser: 0.7 mg/dL (ref 0.61–1.24)
Glucose, Bld: 203 mg/dL — ABNORMAL HIGH (ref 70–99)
HCT: 46 % (ref 39.0–52.0)
Hemoglobin: 15.6 g/dL (ref 13.0–17.0)
Potassium: 3.3 mmol/L — ABNORMAL LOW (ref 3.5–5.1)
Sodium: 140 mmol/L (ref 135–145)
TCO2: 17 mmol/L — ABNORMAL LOW (ref 22–32)

## 2022-10-05 LAB — COMPREHENSIVE METABOLIC PANEL
ALT: 27 U/L (ref 0–44)
AST: 37 U/L (ref 15–41)
Albumin: 4.1 g/dL (ref 3.5–5.0)
Alkaline Phosphatase: 55 U/L (ref 38–126)
Anion gap: 13 (ref 5–15)
BUN: 14 mg/dL (ref 8–23)
CO2: 16 mmol/L — ABNORMAL LOW (ref 22–32)
Calcium: 9.3 mg/dL (ref 8.9–10.3)
Chloride: 109 mmol/L (ref 98–111)
Creatinine, Ser: 0.95 mg/dL (ref 0.61–1.24)
GFR, Estimated: >60 mL/min (ref >60)
Glucose, Bld: 193 mg/dL — ABNORMAL HIGH (ref 70–99)
Potassium: 3.2 mmol/L — ABNORMAL LOW (ref 3.5–5.1)
Sodium: 138 mmol/L (ref 135–145)
Total Bilirubin: 0.6 mg/dL (ref 0.3–1.2)
Total Protein: 6.8 g/dL (ref 6.5–8.1)

## 2022-10-05 LAB — TROPONIN I (HIGH SENSITIVITY)
Troponin I (High Sensitivity): 28 ng/L — ABNORMAL HIGH (ref <18)
Troponin I (High Sensitivity): 14613 ng/L (ref <18)

## 2022-10-05 LAB — LIPID PANEL
Cholesterol: 245 mg/dL — ABNORMAL HIGH (ref 0–200)
HDL: 59 mg/dL (ref >40)
LDL Cholesterol: 162 mg/dL — ABNORMAL HIGH (ref 0–99)
Total CHOL/HDL Ratio: 4.2 RATIO
Triglycerides: 122 mg/dL (ref <150)
VLDL: 24 mg/dL (ref 0–40)

## 2022-10-05 LAB — HEMOGLOBIN A1C
Hgb A1c MFr Bld: 5.7 % — ABNORMAL HIGH (ref 4.8–5.6)
Mean Plasma Glucose: 117 mg/dL

## 2022-10-05 LAB — APTT: aPTT: 27 seconds (ref 24–36)

## 2022-10-05 LAB — POCT ACTIVATED CLOTTING TIME
Activated Clotting Time: 260 seconds
Activated Clotting Time: 460 seconds
Activated Clotting Time: 244 seconds

## 2022-10-05 LAB — PROTIME-INR
INR: 1 (ref 0.8–1.2)
Prothrombin Time: 13.2 seconds (ref 11.4–15.2)

## 2022-10-05 SURGERY — LEFT HEART CATH AND CORONARY ANGIOGRAPHY
Anesthesia: LOCAL

## 2022-10-05 MED ORDER — CHLORHEXIDINE GLUCONATE CLOTH 2 % EX PADS
6.0000 | MEDICATED_PAD | Freq: Every day | CUTANEOUS | Status: DC
  Administered 2022-10-05 – 2022-10-09 (×4): 6 via TOPICAL

## 2022-10-05 MED ORDER — NITROGLYCERIN 1 MG/10 ML FOR IR/CATH LAB
INTRA_ARTERIAL | Status: AC
  Filled 2022-10-05: qty 10

## 2022-10-05 MED ORDER — ASPIRIN 81 MG PO CHEW
81.0000 mg | CHEWABLE_TABLET | Freq: Every day | ORAL | Status: DC
  Administered 2022-10-06 – 2022-10-07 (×2): 81 mg via ORAL
  Filled 2022-10-05 (×2): qty 1

## 2022-10-05 MED ORDER — SODIUM CHLORIDE 0.9 % IV SOLN
INTRAVENOUS | Status: AC | PRN
  Administered 2022-10-05: 20 mL/h via INTRAVENOUS

## 2022-10-05 MED ORDER — VERAPAMIL HCL 2.5 MG/ML IV SOLN
INTRAVENOUS | Status: AC
  Filled 2022-10-05: qty 2

## 2022-10-05 MED ORDER — TICAGRELOR 90 MG PO TABS
90.0000 mg | ORAL_TABLET | Freq: Two times a day (BID) | ORAL | Status: DC
  Administered 2022-10-05 – 2022-10-09 (×8): 90 mg via ORAL
  Filled 2022-10-05 (×8): qty 1

## 2022-10-05 MED ORDER — LABETALOL HCL 5 MG/ML IV SOLN: 10.0000 mg | INTRAVENOUS | Status: AC | PRN

## 2022-10-05 MED ORDER — HEPARIN SODIUM (PORCINE) 1000 UNIT/ML IJ SOLN
INTRAMUSCULAR | Status: AC
  Filled 2022-10-05: qty 10

## 2022-10-05 MED ORDER — ONDANSETRON HCL 4 MG/2ML IJ SOLN: 4.0000 mg | Freq: Four times a day (QID) | INTRAMUSCULAR | Status: DC | PRN

## 2022-10-05 MED ORDER — NITROGLYCERIN 1 MG/10 ML FOR IR/CATH LAB
INTRA_ARTERIAL | Status: DC | PRN
  Administered 2022-10-05: 100 ug via INTRACORONARY
  Administered 2022-10-05: 200 ug via INTRA_ARTERIAL
  Administered 2022-10-05: 100 ug via INTRACORONARY
  Administered 2022-10-05: 100 ug via INTRA_ARTERIAL

## 2022-10-05 MED ORDER — HYDRALAZINE HCL 20 MG/ML IJ SOLN: 10.0000 mg | INTRAMUSCULAR | Status: AC | PRN

## 2022-10-05 MED ORDER — IOHEXOL 350 MG/ML SOLN
INTRAVENOUS | Status: DC | PRN
  Administered 2022-10-05: 170 mL

## 2022-10-05 MED ORDER — TIROFIBAN HCL IN NACL 5-0.9 MG/100ML-% IV SOLN
INTRAVENOUS | Status: AC | PRN
  Administered 2022-10-05: .15 ug/kg/min via INTRAVENOUS

## 2022-10-05 MED ORDER — SODIUM CHLORIDE 0.9 % IV SOLN: INTRAVENOUS | Status: DC

## 2022-10-05 MED ORDER — SODIUM CHLORIDE 0.9% FLUSH
3.0000 mL | Freq: Two times a day (BID) | INTRAVENOUS | Status: DC
  Administered 2022-10-05: 3 mL via INTRAVENOUS

## 2022-10-05 MED ORDER — LIDOCAINE HCL (PF) 1 % IJ SOLN
INTRAMUSCULAR | Status: AC
  Filled 2022-10-05: qty 30

## 2022-10-05 MED ORDER — NITROGLYCERIN IN D5W 200-5 MCG/ML-% IV SOLN
INTRAVENOUS | Status: AC | PRN
  Administered 2022-10-05: 10 ug/min via INTRAVENOUS

## 2022-10-05 MED ORDER — POTASSIUM CHLORIDE CRYS ER 20 MEQ PO TBCR
40.0000 meq | EXTENDED_RELEASE_TABLET | ORAL | Status: AC
  Administered 2022-10-05 (×2): 40 meq via ORAL
  Filled 2022-10-05 (×2): qty 2

## 2022-10-05 MED ORDER — TIROFIBAN HCL IN NACL 5-0.9 MG/100ML-% IV SOLN
INTRAVENOUS | Status: AC
  Filled 2022-10-05: qty 100

## 2022-10-05 MED ORDER — TIROFIBAN HCL IN NACL 5-0.9 MG/100ML-% IV SOLN
0.1500 ug/kg/min | INTRAVENOUS | Status: DC
  Administered 2022-10-05 – 2022-10-06 (×3): 0.15 ug/kg/min via INTRAVENOUS
  Filled 2022-10-05 (×3): qty 100

## 2022-10-05 MED ORDER — LIDOCAINE HCL (PF) 1 % IJ SOLN
INTRAMUSCULAR | Status: DC | PRN
  Administered 2022-10-05: 2 mL

## 2022-10-05 MED ORDER — ACETAMINOPHEN 325 MG PO TABS
650.0000 mg | ORAL_TABLET | ORAL | Status: DC | PRN
  Filled 2022-10-05: qty 2

## 2022-10-05 MED ORDER — NITROGLYCERIN IN D5W 200-5 MCG/ML-% IV SOLN: 0.0000 ug/min | INTRAVENOUS | Status: DC

## 2022-10-05 MED ORDER — SODIUM CHLORIDE 0.9% FLUSH: 3.0000 mL | INTRAVENOUS | Status: DC | PRN

## 2022-10-05 MED ORDER — HEPARIN (PORCINE) IN NACL 1000-0.9 UT/500ML-% IV SOLN
INTRAVENOUS | Status: AC
  Filled 2022-10-05: qty 1000

## 2022-10-05 MED ORDER — MIDAZOLAM HCL 2 MG/2ML IJ SOLN
INTRAMUSCULAR | Status: DC | PRN
  Administered 2022-10-05: 2 mg via INTRAVENOUS

## 2022-10-05 MED ORDER — TIROFIBAN (AGGRASTAT) BOLUS VIA INFUSION
INTRAVENOUS | Status: DC | PRN
  Administered 2022-10-05: 2267.5 ug via INTRAVENOUS

## 2022-10-05 MED ORDER — SODIUM CHLORIDE 0.9 % IV SOLN: 250.0000 mL | INTRAVENOUS | Status: DC | PRN

## 2022-10-05 MED ORDER — MIDAZOLAM HCL 2 MG/2ML IJ SOLN
INTRAMUSCULAR | Status: AC
  Filled 2022-10-05: qty 2

## 2022-10-05 MED ORDER — HEPARIN SODIUM (PORCINE) 1000 UNIT/ML IJ SOLN
INTRAMUSCULAR | Status: DC | PRN
  Administered 2022-10-05: 3000 [IU] via INTRAVENOUS
  Administered 2022-10-05: 9000 [IU] via INTRAVENOUS

## 2022-10-05 MED ORDER — FENTANYL CITRATE (PF) 100 MCG/2ML IJ SOLN
INTRAMUSCULAR | Status: AC
  Filled 2022-10-05: qty 2

## 2022-10-05 MED ORDER — HEPARIN (PORCINE) IN NACL 1000-0.9 UT/500ML-% IV SOLN
INTRAVENOUS | Status: DC | PRN
  Administered 2022-10-05 (×2): 500 mL

## 2022-10-05 MED ORDER — FENTANYL CITRATE (PF) 100 MCG/2ML IJ SOLN
INTRAMUSCULAR | Status: DC | PRN
  Administered 2022-10-05: 25 ug via INTRAVENOUS

## 2022-10-05 MED ORDER — VERAPAMIL HCL 2.5 MG/ML IV SOLN
INTRAVENOUS | Status: DC | PRN
  Administered 2022-10-05 (×3): 10 mL via INTRA_ARTERIAL

## 2022-10-05 MED ORDER — TICAGRELOR 90 MG PO TABS
ORAL_TABLET | ORAL | Status: DC | PRN
  Administered 2022-10-05: 180 mg via ORAL

## 2022-10-05 SURGICAL SUPPLY — 22 items
BALLN EMERGE MR 2.5X15 (BALLOONS) ×1
BALLN ~~LOC~~ EMERGE MR 3.75X12 (BALLOONS) ×1
BALLOON EMERGE MR 2.5X15 (BALLOONS) IMPLANT
BALLOON ~~LOC~~ EMERGE MR 3.75X12 (BALLOONS) IMPLANT
CATH INFINITI JR4 5F (CATHETERS) IMPLANT
CATH LAUNCHER 5F EBU3.5 (CATHETERS) IMPLANT
CATH LAUNCHER 5F JR4 (CATHETERS) IMPLANT
CATH OPTITORQUE TIG 4.0 5F (CATHETERS) IMPLANT
CATH VISTA GUIDE 6FR XBLAD3.5 (CATHETERS) IMPLANT
DEVICE RAD COMP TR BAND LRG (VASCULAR PRODUCTS) IMPLANT
ELECT DEFIB PAD ADLT CADENCE (PAD) IMPLANT
GLIDESHEATH SLEND SS 6F .021 (SHEATH) IMPLANT
GUIDEWIRE INQWIRE 1.5J.035X260 (WIRE) IMPLANT
INQWIRE 1.5J .035X260CM (WIRE) ×1
KIT ENCORE 26 ADVANTAGE (KITS) IMPLANT
KIT HEART LEFT (KITS) ×1 IMPLANT
PACK CARDIAC CATHETERIZATION (CUSTOM PROCEDURE TRAY) ×1 IMPLANT
SHEATH PROBE COVER 6X72 (BAG) IMPLANT
STENT ONYX FRONTIER 3.5X18 (Permanent Stent) IMPLANT
TRANSDUCER W/STOPCOCK (MISCELLANEOUS) ×1 IMPLANT
TUBING CIL FLEX 10 FLL-RA (TUBING) ×1 IMPLANT
WIRE COUGAR XT STRL 190CM (WIRE) IMPLANT
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
## 2022-10-05 NOTE — TOC Benefit Eligibility Note (Signed)
Patient Product/process development scientist completed.    The patient is currently admitted and upon discharge could be taking Brilinta 90 mg.  The current 30 day co-pay is $90.23 due to being in Coverage Gap (donut hole).   The patient is insured through [employer]   Roland Earl, CPHT Pharmacy Patient Advocate Specialist [facility] Patient Advocate Team Direct Number: [phone]  Fax: [phone]

## 2022-10-06 ENCOUNTER — Encounter (HOSPITAL_COMMUNITY): Payer: Self-pay | Admitting: Cardiovascular Disease

## 2022-10-06 ENCOUNTER — Inpatient Hospital Stay (HOSPITAL_COMMUNITY): Payer: Medicare Other

## 2022-10-06 DIAGNOSIS — I2102 ST elevation (STEMI) myocardial infarction involving left anterior descending coronary artery: Secondary | ICD-10-CM

## 2022-10-06 DIAGNOSIS — I2510 Atherosclerotic heart disease of native coronary artery without angina pectoris: Secondary | ICD-10-CM

## 2022-10-06 LAB — ECHOCARDIOGRAM COMPLETE
AR max vel: 2.83 cm2
AV Area VTI: 2.62 cm2
AV Area mean vel: 2.68 cm2
AV Mean grad: 3.3 mmHg
AV Peak grad: 6.2 mmHg
Ao pk vel: 1.25 m/s
P 1/2 time: 1199 msec
S' Lateral: 3.8 cm

## 2022-10-06 LAB — BASIC METABOLIC PANEL
Anion gap: 8 (ref 5–15)
BUN: 9 mg/dL (ref 8–23)
CO2: 20 mmol/L — ABNORMAL LOW (ref 22–32)
Calcium: 9.5 mg/dL (ref 8.9–10.3)
Chloride: 110 mmol/L (ref 98–111)
Creatinine, Ser: 0.88 mg/dL (ref 0.61–1.24)
GFR, Estimated: >60 mL/min (ref >60)
Glucose, Bld: 130 mg/dL — ABNORMAL HIGH (ref 70–99)
Potassium: 4 mmol/L (ref 3.5–5.1)
Sodium: 138 mmol/L (ref 135–145)

## 2022-10-06 LAB — CBC
HCT: 45.6 % (ref 39.0–52.0)
Hemoglobin: 16 g/dL (ref 13.0–17.0)
MCH: 32.9 pg (ref 26.0–34.0)
MCHC: 35.1 g/dL (ref 30.0–36.0)
MCV: 93.8 fL (ref 80.0–100.0)
Platelets: 166 10*3/uL (ref 150–400)
RBC: 4.86 MIL/uL (ref 4.22–5.81)
RDW: 14.3 % (ref 11.5–15.5)
WBC: 10.2 10*3/uL (ref 4.0–10.5)
nRBC: 0 % (ref 0.0–0.2)

## 2022-10-06 LAB — MAGNESIUM: Magnesium: 1.8 mg/dL (ref 1.7–2.4)

## 2022-10-06 LAB — TROPONIN I (HIGH SENSITIVITY): Troponin I (High Sensitivity): >24000 ng/L (ref <18)

## 2022-10-06 MED ORDER — ATORVASTATIN CALCIUM 80 MG PO TABS
80.0000 mg | ORAL_TABLET | Freq: Every day | ORAL | Status: DC
  Administered 2022-10-06 – 2022-10-09 (×3): 80 mg via ORAL
  Filled 2022-10-06 (×4): qty 1

## 2022-10-06 MED ORDER — NITROGLYCERIN 0.4 MG SL SUBL: 0.4000 mg | SUBLINGUAL_TABLET | SUBLINGUAL | Status: DC | PRN

## 2022-10-06 MED ORDER — SODIUM CHLORIDE 0.9% FLUSH
3.0000 mL | Freq: Two times a day (BID) | INTRAVENOUS | Status: DC
  Administered 2022-10-06: 3 mL via INTRAVENOUS

## 2022-10-06 MED ORDER — SODIUM CHLORIDE 0.9 % IV SOLN: 250.0000 mL | INTRAVENOUS | Status: DC | PRN

## 2022-10-06 MED ORDER — SODIUM CHLORIDE 0.9 % WEIGHT BASED INFUSION
3.0000 mL/kg/h | INTRAVENOUS | Status: DC
  Administered 2022-10-07: 3 mL/kg/h via INTRAVENOUS

## 2022-10-06 MED ORDER — SODIUM CHLORIDE 0.9 % WEIGHT BASED INFUSION
1.0000 mL/kg/h | INTRAVENOUS | Status: DC
  Administered 2022-10-07: 1 mL/kg/h via INTRAVENOUS

## 2022-10-06 MED ORDER — SODIUM CHLORIDE 0.9% FLUSH: 3.0000 mL | INTRAVENOUS | Status: DC | PRN

## 2022-10-06 MED ORDER — METOPROLOL SUCCINATE ER 50 MG PO TB24
50.0000 mg | ORAL_TABLET | Freq: Every day | ORAL | Status: DC
  Administered 2022-10-06 – 2022-10-09 (×3): 50 mg via ORAL
  Filled 2022-10-06 (×4): qty 1

## 2022-10-06 MED ORDER — ASPIRIN 81 MG PO CHEW
81.0000 mg | CHEWABLE_TABLET | ORAL | Status: AC
  Administered 2022-10-07: 81 mg via ORAL
  Filled 2022-10-06: qty 1

## 2022-10-06 MED FILL — Verapamil HCl IV Soln 2.5 MG/ML: INTRAVENOUS | Qty: 2 | Status: AC

## 2022-10-06 NOTE — Progress Notes (Signed)
Rounding Note    Patient Name: [name] Date of Encounter: 10/06/2022  [facility] Cardiologist:  Tresa Endo  Subjective   No chest pain or SOB  Inpatient Medications    Scheduled Meds:  aspirin  81 mg Oral Daily   Chlorhexidine Gluconate Cloth  6 each Topical Daily   sodium chloride flush  3 mL Intravenous Q12H   ticagrelor  90 mg Oral BID   Continuous Infusions:  sodium chloride 50 mL/hr at 10/06/22 0645   sodium chloride     nitroGLYCERIN Stopped (10/05/22 1306)   PRN Meds: sodium chloride, acetaminophen, ondansetron (ZOFRAN) IV, sodium chloride flush   Vital Signs    Vitals:   10/06/22 0400 10/06/22 0500 10/06/22 0600 10/06/22 0700  BP: 118/85 105/73 103/76 118/84  Pulse: 85 73 73 88  Resp: 14 18 (!) 37 (!) 21  Temp:      TempSrc:      SpO2: 98% 97% 94% 96%    Intake/Output Summary (Last 24 hours) at 10/06/2022 0801 Last data filed at 10/06/2022 0645 Gross per 24 hour  Intake 2604.37 ml  Output 1375 ml  Net 1229.37 ml      03/26/2014    8:53 AM 03/15/2014   12:58 PM  Last 3 Weights  Weight (lbs) 211 lb 10 oz 211 lb 10.3 oz  Weight (kg) 95.992 kg 96 kg      Telemetry    Sinus, runs of VT - Personally Reviewed  ECG    Sinus, Subtle anterior ST elevation - Personally Reviewed  Physical Exam   GEN: No acute distress.   Neck: No JVD Cardiac: RRR, no murmurs, rubs, or gallops.  Respiratory: Clear to auscultation bilaterally. GI: Soft, nontender, non-distended  MS: No edema; No deformity. Neuro:  Nonfocal  Psych: Normal affect   Labs    High Sensitivity Troponin:   Recent Labs  Lab 10/05/22 1040 10/05/22 1244 10/06/22 0219  TROPONINIHS 28* 14,613* >24,000*     Chemistry Recent Labs  Lab 10/05/22 1040 10/05/22 1044 10/05/22 1204 10/06/22 0223  NA 138 140 139 138  K 3.2* 3.3* 3.7 4.0  CL 109 109  --  110  CO2 16*  --   --  20*  GLUCOSE 193* 203*  --  130*  BUN 14 14  --  9  CREATININE 0.95 0.70  --  0.88   CALCIUM 9.3  --   --  9.5  MG  --   --   --  1.8  PROT 6.8  --   --   --   ALBUMIN 4.1  --   --   --   AST 37  --   --   --   ALT 27  --   --   --   ALKPHOS 55  --   --   --   BILITOT 0.6  --   --   --   GFRNONAA >60  --   --  >60  ANIONGAP 13  --   --  8    Lipids  Recent Labs  Lab 10/05/22 1040  CHOL 245*  TRIG 122  HDL 59  LDLCALC 162*  CHOLHDL 4.2    Hematology Recent Labs  Lab 10/05/22 1040 10/05/22 1044 10/05/22 1204 10/05/22 1929 10/06/22 0223  WBC 10.3  --   --  9.4 10.2  RBC 4.60  --   --  4.66 4.86  HGB 15.4   < > 15.0 15.8 16.0  HCT  42.2   < > 44.0 43.5 45.6  MCV 91.7  --   --  93.3 93.8  MCH 33.5  --   --  33.9 32.9  MCHC 36.5*  --   --  36.3* 35.1  RDW 14.2  --   --  14.2 14.3  PLT 145*  --   --  162 166   < > = values in this interval not displayed.   Thyroid No results for input(s): "TSH", "FREET4" in the last 168 hours.  BNPNo results for input(s): "BNP", "PROBNP" in the last 168 hours.  DDimer No results for input(s): "DDIMER" in the last 168 hours.   Radiology    CARDIAC CATHETERIZATION  Result Date: 10/05/2022   Mid LAD-2 lesion is 20% stenosed.   Ost LAD to Mid LAD lesion is 100% stenosed.   Mid LAD-1 lesion is 10% stenosed.   2nd Mrg lesion is 90% stenosed.   A drug-eluting stent was successfully placed.   Post intervention, there is a 0% residual stenosis. Acute ST segment elevation myocardial infarction secondary to total very proximal occlusion of a large dominant LAD with significant thrombus burden. Left circumflex vessel has 90% stenosis in a very large caliber vessel. Normal ramus intermediate and large dominant RCA. Moderate acute LV dysfunction with EF estimated at 35 to 40% with hypocontractility involving the mid distal anterolateral wall and apex.  LVEDP 27 mmHg. Difficult but successful PCI to the totally occluded proximal LAD with ultimate insertion of a 3.5 x 18 mm Medtronic Onyx Frontier stent postdilated to 3.8 mm with the 100%  occlusion being reduced to 0% resumption of brisk TIMI-3 flow large LAD. Aggrastat bolus plus infusion for significant thrombus burden RECOMMENDATION: DAPT with aspirin/ticagrelor for minimum of 1 year.  Initiate post MI low-dose beta-blocker therapy and ARB.  Continue Aggrastat infusion for 18 hours.  Plan 2D echo Doppler study in a.m.  Aggressive lipid-lowering therapy with target LDL less than 70.  Plan several days for LV function recovery with staged PCI to the left circumflex vessel.    Cardiac Studies   See above  Patient Profile     76 y.o. male with history of HTN, GERD admitted with an anterior STEMI on 10/05/22. Dr. Tresa Endo performed his cardiac cath. He was found to have an occluded ostial LAD, treated with a drug eluting stent. Also with severe disease in the obtuse marginal branch.   Assessment & Plan    CAD/Anterior STEMI: Doing well this am. No chest pain. Will continue DAPT with ASA and Brilinta for one year. Will start high intensity statin. Will resume his home beta blocker and amlodipine. Echo will be ordered today. He will need staged PCI of the obtuse marginal branch. Will plan this for tomorrow. Transfer to telemetry unit today.   For questions or updates, please contact [facility] Please consult [email or web address] for contact info under        Signed, Verne Carrow, MD  10/06/2022, 8:01 AM

## 2022-10-06 NOTE — H&P (View-Only) (Signed)
Rounding Note    Patient Name: [name] Date of Encounter: 10/06/2022  [facility] Cardiologist:  Tresa Endo  Subjective   No chest pain or SOB  Inpatient Medications    Scheduled Meds:  aspirin  81 mg Oral Daily   Chlorhexidine Gluconate Cloth  6 each Topical Daily   sodium chloride flush  3 mL Intravenous Q12H   ticagrelor  90 mg Oral BID   Continuous Infusions:  sodium chloride 50 mL/hr at 10/06/22 0645   sodium chloride     nitroGLYCERIN Stopped (10/05/22 1306)   PRN Meds: sodium chloride, acetaminophen, ondansetron (ZOFRAN) IV, sodium chloride flush   Vital Signs    Vitals:   10/06/22 0400 10/06/22 0500 10/06/22 0600 10/06/22 0700  BP: 118/85 105/73 103/76 118/84  Pulse: 85 73 73 88  Resp: 14 18 (!) 37 (!) 21  Temp:      TempSrc:      SpO2: 98% 97% 94% 96%    Intake/Output Summary (Last 24 hours) at 10/06/2022 0801 Last data filed at 10/06/2022 0645 Gross per 24 hour  Intake 2604.37 ml  Output 1375 ml  Net 1229.37 ml      03/26/2014    8:53 AM 03/15/2014   12:58 PM  Last 3 Weights  Weight (lbs) 211 lb 10 oz 211 lb 10.3 oz  Weight (kg) 95.992 kg 96 kg      Telemetry    Sinus, runs of VT - Personally Reviewed  ECG    Sinus, Subtle anterior ST elevation - Personally Reviewed  Physical Exam   GEN: No acute distress.   Neck: No JVD Cardiac: RRR, no murmurs, rubs, or gallops.  Respiratory: Clear to auscultation bilaterally. GI: Soft, nontender, non-distended  MS: No edema; No deformity. Neuro:  Nonfocal  Psych: Normal affect   Labs    High Sensitivity Troponin:   Recent Labs  Lab 10/05/22 1040 10/05/22 1244 10/06/22 0219  TROPONINIHS 28* 14,613* >24,000*     Chemistry Recent Labs  Lab 10/05/22 1040 10/05/22 1044 10/05/22 1204 10/06/22 0223  NA 138 140 139 138  K 3.2* 3.3* 3.7 4.0  CL 109 109  --  110  CO2 16*  --   --  20*  GLUCOSE 193* 203*  --  130*  BUN 14 14  --  9  CREATININE 0.95 0.70  --  0.88   CALCIUM 9.3  --   --  9.5  MG  --   --   --  1.8  PROT 6.8  --   --   --   ALBUMIN 4.1  --   --   --   AST 37  --   --   --   ALT 27  --   --   --   ALKPHOS 55  --   --   --   BILITOT 0.6  --   --   --   GFRNONAA >60  --   --  >60  ANIONGAP 13  --   --  8    Lipids  Recent Labs  Lab 10/05/22 1040  CHOL 245*  TRIG 122  HDL 59  LDLCALC 162*  CHOLHDL 4.2    Hematology Recent Labs  Lab 10/05/22 1040 10/05/22 1044 10/05/22 1204 10/05/22 1929 10/06/22 0223  WBC 10.3  --   --  9.4 10.2  RBC 4.60  --   --  4.66 4.86  HGB 15.4   < > 15.0 15.8 16.0  HCT  42.2   < > 44.0 43.5 45.6  MCV 91.7  --   --  93.3 93.8  MCH 33.5  --   --  33.9 32.9  MCHC 36.5*  --   --  36.3* 35.1  RDW 14.2  --   --  14.2 14.3  PLT 145*  --   --  162 166   < > = values in this interval not displayed.   Thyroid No results for input(s): "TSH", "FREET4" in the last 168 hours.  BNPNo results for input(s): "BNP", "PROBNP" in the last 168 hours.  DDimer No results for input(s): "DDIMER" in the last 168 hours.   Radiology    CARDIAC CATHETERIZATION  Result Date: 10/05/2022   Mid LAD-2 lesion is 20% stenosed.   Ost LAD to Mid LAD lesion is 100% stenosed.   Mid LAD-1 lesion is 10% stenosed.   2nd Mrg lesion is 90% stenosed.   A drug-eluting stent was successfully placed.   Post intervention, there is a 0% residual stenosis. Acute ST segment elevation myocardial infarction secondary to total very proximal occlusion of a large dominant LAD with significant thrombus burden. Left circumflex vessel has 90% stenosis in a very large caliber vessel. Normal ramus intermediate and large dominant RCA. Moderate acute LV dysfunction with EF estimated at 35 to 40% with hypocontractility involving the mid distal anterolateral wall and apex.  LVEDP 27 mmHg. Difficult but successful PCI to the totally occluded proximal LAD with ultimate insertion of a 3.5 x 18 mm Medtronic Onyx Frontier stent postdilated to 3.8 mm with the 100%  occlusion being reduced to 0% resumption of brisk TIMI-3 flow large LAD. Aggrastat bolus plus infusion for significant thrombus burden RECOMMENDATION: DAPT with aspirin/ticagrelor for minimum of 1 year.  Initiate post MI low-dose beta-blocker therapy and ARB.  Continue Aggrastat infusion for 18 hours.  Plan 2D echo Doppler study in a.m.  Aggressive lipid-lowering therapy with target LDL less than 70.  Plan several days for LV function recovery with staged PCI to the left circumflex vessel.    Cardiac Studies   See above  Patient Profile     76 y.o. male with history of HTN, GERD admitted with an anterior STEMI on 10/05/22. Dr. Tresa Endo performed his cardiac cath. He was found to have an occluded ostial LAD, treated with a drug eluting stent. Also with severe disease in the obtuse marginal branch.   Assessment & Plan    CAD/Anterior STEMI: Doing well this am. No chest pain. Will continue DAPT with ASA and Brilinta for one year. Will start high intensity statin. Will resume his home beta blocker and amlodipine. Echo will be ordered today. He will need staged PCI of the obtuse marginal branch. Will plan this for tomorrow. Transfer to telemetry unit today.   For questions or updates, please contact [facility] Please consult [email or web address] for contact info under        Signed, Verne Carrow, MD  10/06/2022, 8:01 AM

## 2022-10-06 NOTE — Progress Notes (Incomplete)
  Echocardiogram 2D Echocardiogram has been performed.  Lucendia Herrlich 10/06/2022, 4:45 PM
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
## 2022-10-06 NOTE — Progress Notes (Signed)
CARDIAC REHAB PHASE I   PRE:  Rate/Rhythm: NSR, 71    BP: sitting 128/89     SaO2: 100%, RA  MODE:  Ambulation: 430 ft   POST:  Rate/Rhythm: NSR, 96    BP: sitting 139/103     SaO2: 98%, RA    Pt sitting in bed agreed to ambulate with RN. Pt denied CP, SOB while walking and pt returned to recliner.  Pt was educated about heart attack, post wound education and restrictions, heart healthy diet, exercise, nitro, outpatient cardiac rehab, and medication (ASA and Brilinta importance) and follow up MD appointment compliance. All questions were answered and information book and handouts provided. Pt will be referred to cardiac rehab in [place]. Pt understands without assistance. Call bell in reach, no other needs expressed.   Service time is from [address] to [address], RN, BSN 10/06/2022 12:16 PM

## 2022-10-07 ENCOUNTER — Inpatient Hospital Stay (HOSPITAL_COMMUNITY): Payer: Medicare Other

## 2022-10-07 ENCOUNTER — Encounter (HOSPITAL_COMMUNITY): Payer: Self-pay | Admitting: Cardiology

## 2022-10-07 ENCOUNTER — Inpatient Hospital Stay (HOSPITAL_COMMUNITY)
Admission: EM | Disposition: A | Discharge status: Home / Self Care | Payer: Self-pay | Source: Home / Self Care | Attending: Cardiovascular Disease

## 2022-10-07 DIAGNOSIS — I2510 Atherosclerotic heart disease of native coronary artery without angina pectoris: Secondary | ICD-10-CM

## 2022-10-07 DIAGNOSIS — I2102 ST elevation (STEMI) myocardial infarction involving left anterior descending coronary artery: Secondary | ICD-10-CM | POA: Diagnosis not present

## 2022-10-07 HISTORY — PX: CORONARY BALLOON ANGIOPLASTY: CATH118233

## 2022-10-07 HISTORY — PX: CORONARY STENT INTERVENTION: CATH118234

## 2022-10-07 LAB — BASIC METABOLIC PANEL
Anion gap: 10 (ref 5–15)
BUN: 14 mg/dL (ref 8–23)
CO2: 20 mmol/L — ABNORMAL LOW (ref 22–32)
Calcium: 9.6 mg/dL (ref 8.9–10.3)
Chloride: 110 mmol/L (ref 98–111)
Creatinine, Ser: 1.09 mg/dL (ref 0.61–1.24)
GFR, Estimated: >60 mL/min (ref >60)
Glucose, Bld: 109 mg/dL — ABNORMAL HIGH (ref 70–99)
Potassium: 3.6 mmol/L (ref 3.5–5.1)
Sodium: 140 mmol/L (ref 135–145)

## 2022-10-07 LAB — CBC
HCT: 44.8 % (ref 39.0–52.0)
Hemoglobin: 15.8 g/dL (ref 13.0–17.0)
MCH: 33.5 pg (ref 26.0–34.0)
MCHC: 35.3 g/dL (ref 30.0–36.0)
MCV: 94.9 fL (ref 80.0–100.0)
Platelets: 155 10*3/uL (ref 150–400)
RBC: 4.72 MIL/uL (ref 4.22–5.81)
RDW: 14.6 % (ref 11.5–15.5)
WBC: 9.6 10*3/uL (ref 4.0–10.5)
nRBC: 0 % (ref 0.0–0.2)

## 2022-10-07 LAB — ECHOCARDIOGRAM LIMITED: Weight: 3065.6 oz

## 2022-10-07 LAB — LIPOPROTEIN A (LPA): Lipoprotein (a): 79.4 nmol/L — ABNORMAL HIGH (ref <75.0)

## 2022-10-07 SURGERY — CORONARY STENT INTERVENTION
Anesthesia: LOCAL

## 2022-10-07 MED ORDER — VERAPAMIL HCL 2.5 MG/ML IV SOLN
INTRAVENOUS | Status: DC | PRN
  Administered 2022-10-07: 10 mL via INTRA_ARTERIAL

## 2022-10-07 MED ORDER — METOPROLOL SUCCINATE ER 50 MG PO TB24: 50.0000 mg | ORAL_TABLET | Freq: Every day | ORAL | Status: DC

## 2022-10-07 MED ORDER — HEPARIN (PORCINE) IN NACL 1000-0.9 UT/500ML-% IV SOLN
INTRAVENOUS | Status: DC | PRN
  Administered 2022-10-07 (×2): 500 mL

## 2022-10-07 MED ORDER — FENTANYL CITRATE (PF) 100 MCG/2ML IJ SOLN
INTRAMUSCULAR | Status: AC
  Filled 2022-10-07: qty 2

## 2022-10-07 MED ORDER — LIDOCAINE HCL (PF) 1 % IJ SOLN
INTRAMUSCULAR | Status: AC
  Filled 2022-10-07: qty 30

## 2022-10-07 MED ORDER — SODIUM CHLORIDE 0.9% FLUSH
3.0000 mL | Freq: Two times a day (BID) | INTRAVENOUS | Status: DC
  Administered 2022-10-07 – 2022-10-08 (×3): 3 mL via INTRAVENOUS

## 2022-10-07 MED ORDER — MIDAZOLAM HCL 2 MG/2ML IJ SOLN
INTRAMUSCULAR | Status: AC
  Filled 2022-10-07: qty 2

## 2022-10-07 MED ORDER — HEPARIN (PORCINE) IN NACL 1000-0.9 UT/500ML-% IV SOLN
INTRAVENOUS | Status: AC
  Filled 2022-10-07: qty 1000

## 2022-10-07 MED ORDER — ATORVASTATIN CALCIUM 80 MG PO TABS: 80.0000 mg | ORAL_TABLET | Freq: Every day | ORAL | Status: DC

## 2022-10-07 MED ORDER — SODIUM CHLORIDE 0.9 % IV SOLN: INTRAVENOUS | Status: AC

## 2022-10-07 MED ORDER — LIDOCAINE HCL (PF) 1 % IJ SOLN
INTRAMUSCULAR | Status: DC | PRN
  Administered 2022-10-07: 2 mL

## 2022-10-07 MED ORDER — HEPARIN SODIUM (PORCINE) 1000 UNIT/ML IJ SOLN
INTRAMUSCULAR | Status: AC
  Filled 2022-10-07: qty 10

## 2022-10-07 MED ORDER — NITROGLYCERIN 1 MG/10 ML FOR IR/CATH LAB
INTRA_ARTERIAL | Status: AC
  Filled 2022-10-07: qty 10

## 2022-10-07 MED ORDER — MIDAZOLAM HCL 2 MG/2ML IJ SOLN
INTRAMUSCULAR | Status: DC | PRN
  Administered 2022-10-07: 1 mg via INTRAVENOUS

## 2022-10-07 MED ORDER — VERAPAMIL HCL 2.5 MG/ML IV SOLN
INTRAVENOUS | Status: AC
  Filled 2022-10-07: qty 2

## 2022-10-07 MED ORDER — IOHEXOL 350 MG/ML SOLN
INTRAVENOUS | Status: DC | PRN
  Administered 2022-10-07: 5 mL

## 2022-10-07 MED ORDER — SODIUM CHLORIDE 0.9 % IV SOLN: 250.0000 mL | INTRAVENOUS | Status: DC | PRN

## 2022-10-07 MED ORDER — PERFLUTREN LIPID MICROSPHERE
1.0000 mL | INTRAVENOUS | Status: AC | PRN
  Administered 2022-10-07: 2 mL via INTRAVENOUS

## 2022-10-07 MED ORDER — HEPARIN SODIUM (PORCINE) 5000 UNIT/ML IJ SOLN
5000.0000 [IU] | Freq: Three times a day (TID) | INTRAMUSCULAR | Status: DC
  Administered 2022-10-07 – 2022-10-08 (×2): 5000 [IU] via SUBCUTANEOUS
  Filled 2022-10-07 (×2): qty 1

## 2022-10-07 MED ORDER — SODIUM CHLORIDE 0.9% FLUSH: 3.0000 mL | INTRAVENOUS | Status: DC | PRN

## 2022-10-07 MED ORDER — FENTANYL CITRATE (PF) 100 MCG/2ML IJ SOLN
INTRAMUSCULAR | Status: DC | PRN
  Administered 2022-10-07: 25 ug via INTRAVENOUS

## 2022-10-07 SURGICAL SUPPLY — 14 items
BALLN EMERGE MR 2.5X12 (BALLOONS) ×1
BALLOON EMERGE MR 2.5X12 (BALLOONS) IMPLANT
CATH LAUNCHER 6FR EBU3.5 (CATHETERS) IMPLANT
DEVICE RAD COMP TR BAND LRG (VASCULAR PRODUCTS) IMPLANT
GLIDESHEATH SLEND SS 6F .021 (SHEATH) IMPLANT
GUIDEWIRE INQWIRE 1.5J.035X260 (WIRE) IMPLANT
INQWIRE 1.5J .035X260CM (WIRE) ×1
KIT ENCORE 26 ADVANTAGE (KITS) IMPLANT
KIT HEART LEFT (KITS) ×1 IMPLANT
PACK CARDIAC CATHETERIZATION (CUSTOM PROCEDURE TRAY) ×1 IMPLANT
TRANSDUCER W/STOPCOCK (MISCELLANEOUS) ×1 IMPLANT
TUBING CIL FLEX 10 FLL-RA (TUBING) ×1 IMPLANT
WIRE ASAHI PROWATER 180CM (WIRE) IMPLANT
WIRE HI TORQ VERSACORE-J 145CM (WIRE) IMPLANT

## 2022-10-07 NOTE — Progress Notes (Addendum)
Rounding Note    Patient Name: [name] Date of Encounter: 10/06/2022  [facility] Cardiologist:  Tresa Endo  Subjective   No chest pain overnight. He came down for cardiac cath today and had a vascular complication with access.   Inpatient Medications    Scheduled Meds:  aspirin  81 mg Oral Daily   Chlorhexidine Gluconate Cloth  6 each Topical Daily   sodium chloride flush  3 mL Intravenous Q12H   ticagrelor  90 mg Oral BID   Continuous Infusions:  sodium chloride 50 mL/hr at 10/06/22 0645   sodium chloride     nitroGLYCERIN Stopped (10/05/22 1306)   PRN Meds: sodium chloride, acetaminophen, ondansetron (ZOFRAN) IV, sodium chloride flush   Vital Signs    Vitals:   10/06/22 0400 10/06/22 0500 10/06/22 0600 10/06/22 0700  BP: 118/85 105/73 103/76 118/84  Pulse: 85 73 73 88  Resp: 14 18 (!) 37 (!) 21  Temp:      TempSrc:      SpO2: 98% 97% 94% 96%    Intake/Output Summary (Last 24 hours) at 10/06/2022 0801 Last data filed at 10/06/2022 0645 Gross per 24 hour  Intake 2604.37 ml  Output 1375 ml  Net 1229.37 ml      03/26/2014    8:53 AM 03/15/2014   12:58 PM  Last 3 Weights  Weight (lbs) 211 lb 10 oz 211 lb 10.3 oz  Weight (kg) 95.992 kg 96 kg      Telemetry    sinus - Personally Reviewed  ECG    Sinus, Subtle anterior ST elevation - Personally Reviewed  Physical Exam   General: Well developed, well nourished, NAD  HEENT: OP clear, mucus membranes moist  SKIN: warm, dry. No rashes. Neuro: No focal deficits  Musculoskeletal: Muscle strength 5/5 all ext  Psychiatric: Mood and affect normal  Neck: No JVD, no carotid bruits, no thyromegaly, no lymphadenopathy.  Lungs:Clear bilaterally, no wheezes, rhonci, crackles Cardiovascular: Regular rate and rhythm. No murmurs, gallops or rubs. Abdomen:Soft. Bowel sounds present. Non-tender.  Extremities: No lower extremity edema. Right forearm with no hematoma   Labs    High Sensitivity  Troponin:   Recent Labs  Lab 10/05/22 1040 10/05/22 1244 10/06/22 0219  TROPONINIHS 28* 14,613* >24,000*     Chemistry Recent Labs  Lab 10/05/22 1040 10/05/22 1044 10/05/22 1204 10/06/22 0223  NA 138 140 139 138  K 3.2* 3.3* 3.7 4.0  CL 109 109  --  110  CO2 16*  --   --  20*  GLUCOSE 193* 203*  --  130*  BUN 14 14  --  9  CREATININE 0.95 0.70  --  0.88  CALCIUM 9.3  --   --  9.5  MG  --   --   --  1.8  PROT 6.8  --   --   --   ALBUMIN 4.1  --   --   --   AST 37  --   --   --   ALT 27  --   --   --   ALKPHOS 55  --   --   --   BILITOT 0.6  --   --   --   GFRNONAA >60  --   --  >60  ANIONGAP 13  --   --  8    Lipids  Recent Labs  Lab 10/05/22 1040  CHOL 245*  TRIG 122  HDL 59  LDLCALC 162*  CHOLHDL 4.2  Hematology Recent Labs  Lab 10/05/22 1040 10/05/22 1044 10/05/22 1204 10/05/22 1929 10/06/22 0223  WBC 10.3  --   --  9.4 10.2  RBC 4.60  --   --  4.66 4.86  HGB 15.4   < > 15.0 15.8 16.0  HCT 42.2   < > 44.0 43.5 45.6  MCV 91.7  --   --  93.3 93.8  MCH 33.5  --   --  33.9 32.9  MCHC 36.5*  --   --  36.3* 35.1  RDW 14.2  --   --  14.2 14.3  PLT 145*  --   --  162 166   < > = values in this interval not displayed.   Thyroid No results for input(s): "TSH", "FREET4" in the last 168 hours.  BNPNo results for input(s): "BNP", "PROBNP" in the last 168 hours.  DDimer No results for input(s): "DDIMER" in the last 168 hours.   Radiology    CARDIAC CATHETERIZATION  Result Date: 10/05/2022   Mid LAD-2 lesion is 20% stenosed.   Ost LAD to Mid LAD lesion is 100% stenosed.   Mid LAD-1 lesion is 10% stenosed.   2nd Mrg lesion is 90% stenosed.   A drug-eluting stent was successfully placed.   Post intervention, there is a 0% residual stenosis. Acute ST segment elevation myocardial infarction secondary to total very proximal occlusion of a large dominant LAD with significant thrombus burden. Left circumflex vessel has 90% stenosis in a very large caliber vessel.  Normal ramus intermediate and large dominant RCA. Moderate acute LV dysfunction with EF estimated at 35 to 40% with hypocontractility involving the mid distal anterolateral wall and apex.  LVEDP 27 mmHg. Difficult but successful PCI to the totally occluded proximal LAD with ultimate insertion of a 3.5 x 18 mm Medtronic Onyx Frontier stent postdilated to 3.8 mm with the 100% occlusion being reduced to 0% resumption of brisk TIMI-3 flow large LAD. Aggrastat bolus plus infusion for significant thrombus burden RECOMMENDATION: DAPT with aspirin/ticagrelor for minimum of 1 year.  Initiate post MI low-dose beta-blocker therapy and ARB.  Continue Aggrastat infusion for 18 hours.  Plan 2D echo Doppler study in a.m.  Aggressive lipid-lowering therapy with target LDL less than 70.  Plan several days for LV function recovery with staged PCI to the left circumflex vessel.    Cardiac Studies   Echo 10/06/22:   1. Mid septum/anterior wall, apical segments, and apex are akinetic  consistent with large wrap around LAD infarction. No obvious LV thrombus  detected but contrast not used. Consider limited TTE with contrast given  extent of infarction. Left ventricular  ejection fraction, by estimation, is 25 to 30%. The left ventricle has  severely decreased function. The left ventricle demonstrates regional wall  motion abnormalities (see scoring diagram/findings for description). There  is mild concentric left  ventricular hypertrophy. Indeterminate diastolic filling due to E-A  fusion.   2. Right ventricular systolic function is normal. The right ventricular  size is normal. Tricuspid regurgitation signal is inadequate for assessing  PA pressure.   3. The mitral valve is grossly normal. No evidence of mitral valve  regurgitation. No evidence of mitral stenosis.   4. The aortic valve is tricuspid. There is mild calcification of the  aortic valve. Aortic valve regurgitation is mild. Aortic valve sclerosis  is  present, with no evidence of aortic valve stenosis.   5. The inferior vena cava is normal in size with greater than 50%  respiratory  variability, suggesting right atrial pressure of 3 mmHg.   Patient Profile     76 y.o. male with history of HTN, GERD admitted with an anterior STEMI on 10/05/22. Dr. Tresa Endo performed his cardiac cath. He was found to have an occluded ostial LAD, treated with a drug eluting stent. Also with severe disease in the obtuse marginal branch.   Assessment & Plan   CAD/Anterior STEMI: No chest pain. PCI aborted today due to complication with access in the right radial artery. Continue DAPT with ASA and Brilinta for one year. Restart Toprol. Start atorvastatin. Plan staged PCI of the obtuse marginal branch tomorrow from the left radial artery.   Ischemic cardiomyopathy: LVEF=25-30% by echo yesterday with akinesis of the anterior wall and apex. Will plan limited echo today with contrast to exclude LV thrombus.  Start beta blocker today. Will add ARB tomorrow if BP tolerates.  Lifevest before discharge.   For questions or updates, please contact [facility] Please consult [email or web address] for contact info under        Signed, Verne Carrow, MD , [facility] 10/06/2022, 8:01 AM

## 2022-10-07 NOTE — Interval H&P Note (Signed)
History and Physical Interval Note:  10/07/2022 7:33 AM  [name]  has presented today for surgery, with the diagnosis of CAD.  The various methods of treatment have been discussed with the patient and family. After consideration of risks, benefits and other options for treatment, the patient has consented to  Procedure(s): CORONARY STENT INTERVENTION (N/A) as a surgical intervention.  The patient's history has been reviewed, patient examined, no change in status, stable for surgery.  I have reviewed the patient's chart and labs.  Questions were answered to the patient's satisfaction.   Cath Lab Visit (complete for each Cath Lab visit)  Clinical Evaluation Leading to the Procedure:   ACS: Yes.    Non-ACS:    Anginal Classification: CCS IV  Anti-ischemic medical therapy: Maximal Therapy (2 or more classes of medications)  Non-Invasive Test Results: No non-invasive testing performed  Prior CABG: No previous CABG        Theron Arista [facility] 10/07/2022 7:33 AM

## 2022-10-07 NOTE — H&P (View-Only) (Signed)
Rounding Note    Patient Name: [name] Date of Encounter: 10/06/2022  [facility] Cardiologist:  Tresa Endo  Subjective   No chest pain overnight. He came down for cardiac cath today and had a vascular complication with access.   Inpatient Medications    Scheduled Meds:  aspirin  81 mg Oral Daily   Chlorhexidine Gluconate Cloth  6 each Topical Daily   sodium chloride flush  3 mL Intravenous Q12H   ticagrelor  90 mg Oral BID   Continuous Infusions:  sodium chloride 50 mL/hr at 10/06/22 0645   sodium chloride     nitroGLYCERIN Stopped (10/05/22 1306)   PRN Meds: sodium chloride, acetaminophen, ondansetron (ZOFRAN) IV, sodium chloride flush   Vital Signs    Vitals:   10/06/22 0400 10/06/22 0500 10/06/22 0600 10/06/22 0700  BP: 118/85 105/73 103/76 118/84  Pulse: 85 73 73 88  Resp: 14 18 (!) 37 (!) 21  Temp:      TempSrc:      SpO2: 98% 97% 94% 96%    Intake/Output Summary (Last 24 hours) at 10/06/2022 0801 Last data filed at 10/06/2022 0645 Gross per 24 hour  Intake 2604.37 ml  Output 1375 ml  Net 1229.37 ml      03/26/2014    8:53 AM 03/15/2014   12:58 PM  Last 3 Weights  Weight (lbs) 211 lb 10 oz 211 lb 10.3 oz  Weight (kg) 95.992 kg 96 kg      Telemetry    sinus - Personally Reviewed  ECG    Sinus, Subtle anterior ST elevation - Personally Reviewed  Physical Exam   General: Well developed, well nourished, NAD  HEENT: OP clear, mucus membranes moist  SKIN: warm, dry. No rashes. Neuro: No focal deficits  Musculoskeletal: Muscle strength 5/5 all ext  Psychiatric: Mood and affect normal  Neck: No JVD, no carotid bruits, no thyromegaly, no lymphadenopathy.  Lungs:Clear bilaterally, no wheezes, rhonci, crackles Cardiovascular: Regular rate and rhythm. No murmurs, gallops or rubs. Abdomen:Soft. Bowel sounds present. Non-tender.  Extremities: No lower extremity edema. Right forearm with no hematoma   Labs    High Sensitivity  Troponin:   Recent Labs  Lab 10/05/22 1040 10/05/22 1244 10/06/22 0219  TROPONINIHS 28* 14,613* >24,000*     Chemistry Recent Labs  Lab 10/05/22 1040 10/05/22 1044 10/05/22 1204 10/06/22 0223  NA 138 140 139 138  K 3.2* 3.3* 3.7 4.0  CL 109 109  --  110  CO2 16*  --   --  20*  GLUCOSE 193* 203*  --  130*  BUN 14 14  --  9  CREATININE 0.95 0.70  --  0.88  CALCIUM 9.3  --   --  9.5  MG  --   --   --  1.8  PROT 6.8  --   --   --   ALBUMIN 4.1  --   --   --   AST 37  --   --   --   ALT 27  --   --   --   ALKPHOS 55  --   --   --   BILITOT 0.6  --   --   --   GFRNONAA >60  --   --  >60  ANIONGAP 13  --   --  8    Lipids  Recent Labs  Lab 10/05/22 1040  CHOL 245*  TRIG 122  HDL 59  LDLCALC 162*  CHOLHDL 4.2  Hematology Recent Labs  Lab 10/05/22 1040 10/05/22 1044 10/05/22 1204 10/05/22 1929 10/06/22 0223  WBC 10.3  --   --  9.4 10.2  RBC 4.60  --   --  4.66 4.86  HGB 15.4   < > 15.0 15.8 16.0  HCT 42.2   < > 44.0 43.5 45.6  MCV 91.7  --   --  93.3 93.8  MCH 33.5  --   --  33.9 32.9  MCHC 36.5*  --   --  36.3* 35.1  RDW 14.2  --   --  14.2 14.3  PLT 145*  --   --  162 166   < > = values in this interval not displayed.   Thyroid No results for input(s): "TSH", "FREET4" in the last 168 hours.  BNPNo results for input(s): "BNP", "PROBNP" in the last 168 hours.  DDimer No results for input(s): "DDIMER" in the last 168 hours.   Radiology    CARDIAC CATHETERIZATION  Result Date: 10/05/2022   Mid LAD-2 lesion is 20% stenosed.   Ost LAD to Mid LAD lesion is 100% stenosed.   Mid LAD-1 lesion is 10% stenosed.   2nd Mrg lesion is 90% stenosed.   A drug-eluting stent was successfully placed.   Post intervention, there is a 0% residual stenosis. Acute ST segment elevation myocardial infarction secondary to total very proximal occlusion of a large dominant LAD with significant thrombus burden. Left circumflex vessel has 90% stenosis in a very large caliber vessel.  Normal ramus intermediate and large dominant RCA. Moderate acute LV dysfunction with EF estimated at 35 to 40% with hypocontractility involving the mid distal anterolateral wall and apex.  LVEDP 27 mmHg. Difficult but successful PCI to the totally occluded proximal LAD with ultimate insertion of a 3.5 x 18 mm Medtronic Onyx Frontier stent postdilated to 3.8 mm with the 100% occlusion being reduced to 0% resumption of brisk TIMI-3 flow large LAD. Aggrastat bolus plus infusion for significant thrombus burden RECOMMENDATION: DAPT with aspirin/ticagrelor for minimum of 1 year.  Initiate post MI low-dose beta-blocker therapy and ARB.  Continue Aggrastat infusion for 18 hours.  Plan 2D echo Doppler study in a.m.  Aggressive lipid-lowering therapy with target LDL less than 70.  Plan several days for LV function recovery with staged PCI to the left circumflex vessel.    Cardiac Studies   Echo 10/06/22:   1. Mid septum/anterior wall, apical segments, and apex are akinetic  consistent with large wrap around LAD infarction. No obvious LV thrombus  detected but contrast not used. Consider limited TTE with contrast given  extent of infarction. Left ventricular  ejection fraction, by estimation, is 25 to 30%. The left ventricle has  severely decreased function. The left ventricle demonstrates regional wall  motion abnormalities (see scoring diagram/findings for description). There  is mild concentric left  ventricular hypertrophy. Indeterminate diastolic filling due to E-A  fusion.   2. Right ventricular systolic function is normal. The right ventricular  size is normal. Tricuspid regurgitation signal is inadequate for assessing  PA pressure.   3. The mitral valve is grossly normal. No evidence of mitral valve  regurgitation. No evidence of mitral stenosis.   4. The aortic valve is tricuspid. There is mild calcification of the  aortic valve. Aortic valve regurgitation is mild. Aortic valve sclerosis  is  present, with no evidence of aortic valve stenosis.   5. The inferior vena cava is normal in size with greater than 50%  respiratory  variability, suggesting right atrial pressure of 3 mmHg.   Patient Profile     76 y.o. male with history of HTN, GERD admitted with an anterior STEMI on 10/05/22. Dr. Claiborne Billings performed his cardiac cath. He was found to have an occluded ostial LAD, treated with a drug eluting stent. Also with severe disease in the obtuse marginal branch.   Assessment & Plan   CAD/Anterior STEMI: No chest pain. PCI aborted today due to complication with access in the right radial artery. Continue DAPT with ASA and Brilinta for one year. Restart Toprol. Start atorvastatin. Plan staged PCI of the obtuse marginal branch tomorrow from the left radial artery.   Ischemic cardiomyopathy: LVEF=25-30% by echo yesterday with akinesis of the anterior wall and apex. Will plan limited echo today with contrast to exclude LV thrombus.  Start beta blocker today. Will add ARB tomorrow if BP tolerates.   For questions or updates, please contact [place] Please consult [email or web address] for contact info under        Signed, Lauree Chandler, MD , [facility] 10/06/2022, 8:01 AM

## 2022-10-07 NOTE — Progress Notes (Signed)
Manual BP cuff to RFA deflated from to , tolerated well

## 2022-10-07 NOTE — Progress Notes (Signed)
Dr. [place] at bedside, BP cuff remains in place to mid forearm, inflated to , TR Band remains in place to right wrist area with 13cc of air in place, rue elevated on pillow and blanket, safety maintained

## 2022-10-07 NOTE — Progress Notes (Signed)
  Echocardiogram 2D Echocardiogram attempted at 1028. Pt is eating breakfast, requested we try again later.  Milda Smart 10/07/2022, 10:29 AM

## 2022-10-07 NOTE — Progress Notes (Signed)
Pt received from cath lab AxOx4, VS wnL and as per flow. (R) radial level 1 with old sanguinous drainage. Pt oriented to 6E processes. Spouse bedside. All questions and concerns addressed. Call bell placed within reach, will continue to monitor and maintain safety.

## 2022-10-07 NOTE — Progress Notes (Signed)
Seen pt to review education for yesterday. Pt understands the importance of exercise and diet modifications on health.   Faustino Congress 2:59 PM 10/07/2022   [id number]

## 2022-10-07 NOTE — Progress Notes (Addendum)
Manual BP cuff deflated rest of way and removed from RFA, TR band remains in place with 13cc of air, RUE remains elevated, pt informed to inform RN if arm begins bleeding or "hardens" and is more painful, whether here in CL holding or on Nursing unit, verbal acknowledgement noted, RFA PIV removed, safety maintained

## 2022-10-07 NOTE — Progress Notes (Signed)
Dr. Clifton James to bedside, evaluated and spoke to patient, ok to transfer back to [facility]

## 2022-10-07 NOTE — Progress Notes (Signed)
  Echocardiogram 2D Echocardiogram has been performed.  Gerda Diss 10/07/2022, 4:11 PM

## 2022-10-08 ENCOUNTER — Encounter (HOSPITAL_COMMUNITY): Payer: Self-pay | Admitting: Interventional Cardiology

## 2022-10-08 ENCOUNTER — Ambulatory Visit (HOSPITAL_COMMUNITY)
Admission: RE | Admit: 2022-10-08 | Payer: Medicare Other | Source: Home / Self Care | Admitting: Interventional Cardiology

## 2022-10-08 ENCOUNTER — Encounter (HOSPITAL_COMMUNITY)
Admission: EM | Disposition: A | Discharge status: Home / Self Care | Payer: Self-pay | Source: Home / Self Care | Attending: Cardiovascular Disease

## 2022-10-08 DIAGNOSIS — I2510 Atherosclerotic heart disease of native coronary artery without angina pectoris: Secondary | ICD-10-CM | POA: Diagnosis not present

## 2022-10-08 DIAGNOSIS — I255 Ischemic cardiomyopathy: Secondary | ICD-10-CM

## 2022-10-08 DIAGNOSIS — I513 Intracardiac thrombosis, not elsewhere classified: Secondary | ICD-10-CM | POA: Diagnosis not present

## 2022-10-08 DIAGNOSIS — I2102 ST elevation (STEMI) myocardial infarction involving left anterior descending coronary artery: Secondary | ICD-10-CM | POA: Diagnosis not present

## 2022-10-08 HISTORY — PX: LEFT HEART CATH: CATH118248

## 2022-10-08 HISTORY — PX: INTRAVASCULAR ULTRASOUND/IVUS: CATH118244

## 2022-10-08 HISTORY — PX: CORONARY STENT INTERVENTION: CATH118234

## 2022-10-08 LAB — POCT ACTIVATED CLOTTING TIME
Activated Clotting Time: 331 seconds
Activated Clotting Time: 342 seconds
Activated Clotting Time: 174 seconds
Activated Clotting Time: 190 seconds

## 2022-10-08 LAB — BASIC METABOLIC PANEL
Anion gap: 9 (ref 5–15)
BUN: 14 mg/dL (ref 8–23)
CO2: 20 mmol/L — ABNORMAL LOW (ref 22–32)
Calcium: 9.6 mg/dL (ref 8.9–10.3)
Chloride: 111 mmol/L (ref 98–111)
Creatinine, Ser: 0.97 mg/dL (ref 0.61–1.24)
GFR, Estimated: >60 mL/min (ref >60)
Glucose, Bld: 106 mg/dL — ABNORMAL HIGH (ref 70–99)
Potassium: 3.8 mmol/L (ref 3.5–5.1)
Sodium: 140 mmol/L (ref 135–145)

## 2022-10-08 LAB — CBC
HCT: 45.7 % (ref 39.0–52.0)
Hemoglobin: 15.9 g/dL (ref 13.0–17.0)
MCH: 33.8 pg (ref 26.0–34.0)
MCHC: 34.8 g/dL (ref 30.0–36.0)
MCV: 97 fL (ref 80.0–100.0)
Platelets: 150 10*3/uL (ref 150–400)
RBC: 4.71 MIL/uL (ref 4.22–5.81)
RDW: 14.5 % (ref 11.5–15.5)
WBC: 10.6 10*3/uL — ABNORMAL HIGH (ref 4.0–10.5)
nRBC: 0 % (ref 0.0–0.2)

## 2022-10-08 SURGERY — CORONARY STENT INTERVENTION
Anesthesia: LOCAL

## 2022-10-08 MED ORDER — LOSARTAN POTASSIUM 25 MG PO TABS
25.0000 mg | ORAL_TABLET | Freq: Every day | ORAL | Status: DC
  Administered 2022-10-08 – 2022-10-09 (×2): 25 mg via ORAL
  Filled 2022-10-08 (×2): qty 1

## 2022-10-08 MED ORDER — ASPIRIN 81 MG PO CHEW
81.0000 mg | CHEWABLE_TABLET | ORAL | Status: AC
  Administered 2022-10-08: 81 mg via ORAL
  Filled 2022-10-08: qty 1

## 2022-10-08 MED ORDER — SODIUM CHLORIDE 0.9% FLUSH: 3.0000 mL | Freq: Two times a day (BID) | INTRAVENOUS | Status: DC

## 2022-10-08 MED ORDER — ASPIRIN 81 MG PO CHEW
81.0000 mg | CHEWABLE_TABLET | Freq: Every day | ORAL | Status: DC
  Administered 2022-10-09: 81 mg via ORAL
  Filled 2022-10-08 (×2): qty 1

## 2022-10-08 MED ORDER — TAMSULOSIN HCL 0.4 MG PO CAPS
0.4000 mg | ORAL_CAPSULE | Freq: Every morning | ORAL | Status: DC
  Administered 2022-10-08 – 2022-10-09 (×2): 0.4 mg via ORAL
  Filled 2022-10-08 (×2): qty 1

## 2022-10-08 MED ORDER — HEPARIN SODIUM (PORCINE) 1000 UNIT/ML IJ SOLN
INTRAMUSCULAR | Status: AC
  Filled 2022-10-08: qty 10

## 2022-10-08 MED ORDER — FENTANYL CITRATE (PF) 100 MCG/2ML IJ SOLN
INTRAMUSCULAR | Status: AC
  Filled 2022-10-08: qty 2

## 2022-10-08 MED ORDER — NITROGLYCERIN 1 MG/10 ML FOR IR/CATH LAB
INTRA_ARTERIAL | Status: DC | PRN
  Administered 2022-10-08: 200 ug via INTRACORONARY

## 2022-10-08 MED ORDER — HEPARIN (PORCINE) IN NACL 1000-0.9 UT/500ML-% IV SOLN
INTRAVENOUS | Status: AC
  Filled 2022-10-08: qty 1000

## 2022-10-08 MED ORDER — HEPARIN (PORCINE) IN NACL 1000-0.9 UT/500ML-% IV SOLN
INTRAVENOUS | Status: DC | PRN
  Administered 2022-10-08 (×2): 500 mL

## 2022-10-08 MED ORDER — SODIUM CHLORIDE 0.9 % IV SOLN: 250.0000 mL | INTRAVENOUS | Status: DC | PRN

## 2022-10-08 MED ORDER — ACETAMINOPHEN 325 MG PO TABS: 650.0000 mg | ORAL_TABLET | ORAL | Status: DC | PRN

## 2022-10-08 MED ORDER — NITROGLYCERIN 1 MG/10 ML FOR IR/CATH LAB
INTRA_ARTERIAL | Status: AC
  Filled 2022-10-08: qty 10

## 2022-10-08 MED ORDER — SODIUM CHLORIDE 0.9 % IV BOLUS
INTRAVENOUS | Status: AC | PRN
  Administered 2022-10-08: 250 mL via INTRAVENOUS

## 2022-10-08 MED ORDER — SODIUM CHLORIDE 0.9 % IV SOLN: INTRAVENOUS | Status: DC

## 2022-10-08 MED ORDER — SODIUM CHLORIDE 0.9% FLUSH: 3.0000 mL | INTRAVENOUS | Status: DC | PRN

## 2022-10-08 MED ORDER — HEPARIN (PORCINE) 25000 UT/250ML-% IV SOLN
1300.0000 [IU]/h | INTRAVENOUS | Status: DC
  Administered 2022-10-08: 1300 [IU]/h via INTRAVENOUS
  Filled 2022-10-08: qty 250

## 2022-10-08 MED ORDER — SODIUM CHLORIDE 0.9 % IV SOLN: INTRAVENOUS | Status: AC

## 2022-10-08 MED ORDER — MIDAZOLAM HCL 2 MG/2ML IJ SOLN
INTRAMUSCULAR | Status: AC
  Filled 2022-10-08: qty 2

## 2022-10-08 MED ORDER — PANTOPRAZOLE SODIUM 40 MG PO TBEC
40.0000 mg | DELAYED_RELEASE_TABLET | Freq: Every day | ORAL | Status: DC
  Administered 2022-10-08 – 2022-10-09 (×2): 40 mg via ORAL
  Filled 2022-10-08 (×2): qty 1

## 2022-10-08 MED ORDER — IOHEXOL 350 MG/ML SOLN
INTRAVENOUS | Status: DC | PRN
  Administered 2022-10-08: 70 mL

## 2022-10-08 MED ORDER — METOPROLOL SUCCINATE ER 50 MG PO TB24: 50.0000 mg | ORAL_TABLET | Freq: Every morning | ORAL | Status: DC

## 2022-10-08 MED ORDER — LIDOCAINE HCL (PF) 1 % IJ SOLN
INTRAMUSCULAR | Status: AC
  Filled 2022-10-08: qty 30

## 2022-10-08 MED ORDER — FENTANYL CITRATE (PF) 100 MCG/2ML IJ SOLN
INTRAMUSCULAR | Status: DC | PRN
  Administered 2022-10-08: 25 ug via INTRAVENOUS

## 2022-10-08 MED ORDER — SODIUM CHLORIDE 0.9% FLUSH
3.0000 mL | Freq: Two times a day (BID) | INTRAVENOUS | Status: DC
  Administered 2022-10-08: 3 mL via INTRAVENOUS

## 2022-10-08 MED ORDER — HYDRALAZINE HCL 20 MG/ML IJ SOLN: 10.0000 mg | INTRAMUSCULAR | Status: AC | PRN

## 2022-10-08 MED ORDER — MIDAZOLAM HCL 2 MG/2ML IJ SOLN
INTRAMUSCULAR | Status: DC | PRN
  Administered 2022-10-08: 2 mg via INTRAVENOUS

## 2022-10-08 MED ORDER — HEPARIN SODIUM (PORCINE) 1000 UNIT/ML IJ SOLN
INTRAMUSCULAR | Status: DC | PRN
  Administered 2022-10-08: 9000 [IU] via INTRAVENOUS

## 2022-10-08 MED ORDER — LIDOCAINE HCL (PF) 1 % IJ SOLN
INTRAMUSCULAR | Status: DC | PRN
  Administered 2022-10-08: 5 mL via INTRADERMAL

## 2022-10-08 MED ORDER — LABETALOL HCL 5 MG/ML IV SOLN: 10.0000 mg | INTRAVENOUS | Status: AC | PRN

## 2022-10-08 MED ORDER — ONDANSETRON HCL 4 MG/2ML IJ SOLN: 4.0000 mg | Freq: Four times a day (QID) | INTRAMUSCULAR | Status: DC | PRN

## 2022-10-08 MED FILL — Heparin Sodium (Porcine) Inj 1000 Unit/ML: INTRAMUSCULAR | Qty: 10 | Status: AC

## 2022-10-08 MED FILL — Nitroglycerin IV Soln 100 MCG/ML in D5W: INTRA_ARTERIAL | Qty: 10 | Status: AC

## 2022-10-08 MED FILL — Verapamil HCl IV Soln 2.5 MG/ML: INTRAVENOUS | Qty: 2 | Status: AC

## 2022-10-08 SURGICAL SUPPLY — 26 items
BALL SAPPHIRE NC24 3.75X12 (BALLOONS) ×1
BALLN SAPPHIRE 3.0X12 (BALLOONS) ×1
BALLOON SAPPHIRE 3.0X12 (BALLOONS) IMPLANT
BALLOON SAPPHIRE NC24 3.75X12 (BALLOONS) IMPLANT
CATH INFINITI JR4 5F (CATHETERS) IMPLANT
CATH LAUNCHER 6FR EBU 4 SH (CATHETERS) IMPLANT
CATH OPTICROSS HD (CATHETERS) IMPLANT
GLIDESHEATH SLEND SS 6F .021 (SHEATH) IMPLANT
GUIDEWIRE INQWIRE 1.5J.035X260 (WIRE) IMPLANT
INQWIRE 1.5J .035X260CM (WIRE) ×1
KIT ENCORE 26 ADVANTAGE (KITS) IMPLANT
KIT HEART LEFT (KITS) ×1 IMPLANT
KIT HEMO VALVE WATCHDOG (MISCELLANEOUS) IMPLANT
KIT MICROPUNCTURE NIT STIFF (SHEATH) IMPLANT
PACK CARDIAC CATHETERIZATION (CUSTOM PROCEDURE TRAY) ×1 IMPLANT
SHEATH PINNACLE 6F 10CM (SHEATH) IMPLANT
SHEATH PROBE COVER 6X72 (BAG) IMPLANT
SLED PULL BACK IVUS (MISCELLANEOUS) IMPLANT
STENT SYNERGY XD 3.50X16 (Permanent Stent) IMPLANT
SYNERGY XD 3.50X16 (Permanent Stent) ×1 IMPLANT
TRANSDUCER W/STOPCOCK (MISCELLANEOUS) ×1 IMPLANT
TUBING CIL FLEX 10 FLL-RA (TUBING) ×1 IMPLANT
WIRE ASAHI PROWATER 180CM (WIRE) IMPLANT
WIRE EMERALD 3MM-J .035X150CM (WIRE) IMPLANT
WIRE EMERALD 3MM-J .035X260CM (WIRE) IMPLANT
WIRE HI TORQ VERSACORE-J 145CM (WIRE) IMPLANT

## 2022-10-08 NOTE — Interval H&P Note (Signed)
Cath Lab Visit (complete for each Cath Lab visit)  Clinical Evaluation Leading to the Procedure:   ACS: Yes.    Non-ACS:    Anginal Classification: CCS IV  Anti-ischemic medical therapy: Minimal Therapy (1 class of medications)  Non-Invasive Test Results: No non-invasive testing performed  Prior CABG: No previous CABG      History and Physical Interval Note:  10/08/2022 7:40 AM  [name]  has presented today for surgery, with the diagnosis of CAD.  The various methods of treatment have been discussed with the patient and family. After consideration of risks, benefits and other options for treatment, the patient has consented to  Procedure(s): CORONARY STENT INTERVENTION (N/A) as a surgical intervention.  The patient's history has been reviewed, patient examined, no change in status, stable for surgery.  I have reviewed the patient's chart and labs.  Questions were answered to the patient's satisfaction.     Lance Muss

## 2022-10-08 NOTE — TOC Initial Note (Addendum)
Transition of Care [facility]) - Initial/Assessment Note    Patient Details  Name: [name] MRN: [id number] Date of Birth: 1946/06/26  Transition of Care [facility]) CM/SW Contact:    Bess Kinds, RN Phone Number: [phone] 10/08/2022, 3:37 PM  Clinical Narrative:                  Spoke with patient on his cell phone to discuss post acute transition. PTA independent with adls and Iadls, driving, no DME. States he will have transportation home at discharge. No barriers identified for outpatient f/u.   Received call from Marcelo Baldy at [facility] concerning Life Vest. Clinical documentation faxed to  [phone].   TOC following for transition needs.   Expected Discharge Plan: Home/Self Care Barriers to Discharge: Continued Medical Work up   Patient Goals and CMS Choice Patient states their goals for this hospitalization and ongoing recovery are:: return home CMS Medicare.gov Compare Post Acute Care list provided to:: Patient Choice offered to / list presented to : Patient  Expected Discharge Plan and Services Expected Discharge Plan: Home/Self Care In-house Referral: NA Discharge Planning Services: CM Consult Post Acute Care Choice: Durable Medical Equipment Living arrangements for the past 2 months: Single Family Home                 DME Arranged: Life vest DME Agency: [employer] Date DME Agency Contacted: 10/08/22 Time DME Agency Contacted: 1535 Representative spoke with at DME Agency: Luberta Robertson HH Arranged: NA HH Agency: NA        Prior Living Arrangements/Services Living arrangements for the past 2 months: Single Family Home Lives with:: Self Patient language and need for interpreter reviewed:: Yes Do you feel safe going back to the place where you live?: Yes      Need for Family Participation in Patient Care: No (Comment)     Criminal Activity/Legal Involvement Pertinent to Current Situation/Hospitalization: No - Comment as needed  Activities of Daily  Living      Permission Sought/Granted                  Emotional Assessment Appearance:: Appears stated age Attitude/Demeanor/Rapport: Engaged Affect (typically observed): Accepting Orientation: : Oriented to Self, Oriented to Place, Oriented to  Time, Oriented to Situation Alcohol / Substance Use: Not Applicable Psych Involvement: No (comment)  Admission diagnosis:  STEMI involving left anterior descending coronary artery [facility]) [I21.02] Patient Active Problem List   Diagnosis Date Noted   ST elevation myocardial infarction involving left anterior descending (LAD) coronary artery (HCC) 10/05/2022   STEMI involving left anterior descending coronary artery (HCC) 10/05/2022   Degenerative tear of medial meniscus of right knee 03/26/2014   Degenerative arthritis of right knee 03/26/2014   PCP:  Grover Canavan, MD Pharmacy:   [id number] [place], [place] - [address] [place] [address] Phone: [phone] Fax: [phone]  [facility] [phone] - [place], [place] - [address] AT [facility] [address] [place] [address] Phone: [phone] Fax: [phone]  [facility] [address] [place] [address] Phone: [phone] Fax: [phone]     Social Determinants of Health (SDOH) Interventions Food Insecurity Interventions: Intervention Not Indicated Housing Interventions: Intervention Not Indicated Transportation Interventions: Intervention Not Indicated Utilities Interventions: Intervention Not Indicated Alcohol Usage Interventions: Intervention Not Indicated (Score <7) Financial Strain Interventions: Intervention Not Indicated  Readmission Risk Interventions  No data to display

## 2022-10-08 NOTE — Progress Notes (Signed)
ANTICOAGULATION CONSULT NOTE  Pharmacy Consult for heparin Indication: LV thrombus  No Known Allergies  Patient Measurements: Weight: 86.9 kg (191 lb 9.6 oz) Heparin Dosing Weight: 87 kg  Vital Signs: Temp: 99.3 F (37.4 C) (12/15 0404) Temp Source: Oral (12/15 0404) BP: 127/80 (12/15 1035) Pulse Rate: 62 (12/15 1035)  Labs: Recent Labs    10/05/22 1244 10/05/22 1929 10/06/22 0219 10/06/22 0223 10/07/22 0203 10/08/22 0204  HGB  --    < >  --  16.0 15.8 15.9  HCT  --    < >  --  45.6 44.8 45.7  PLT  --    < >  --  166 155 150  CREATININE  --   --   --  0.88 1.09 0.97  TROPONINIHS 14,613*  --  >24,000*  --   --   --    < > = values in this interval not displayed.    CrCl cannot be calculated (Unknown ideal weight.).   Medical History: Past Medical History:  Diagnosis Date   GERD (gastroesophageal reflux disease)    H/O hiatal hernia    Hypertension     Assessment: 87 yoM admitted with STEMI. Pt noted to have small LV thrombus on ECHO so IV heparin to resume 8h after sheath removal. Will transition to apixaban tomorrow pending vascular access sites. CBC wnl.  Goal of Therapy:  Heparin level 0.3-0.7 units/ml Monitor platelets by anticoagulation protocol: Yes   Plan:  Start heparin 1300 units/h no bolus at 2000 Check 6h heparin level  Fredonia Highland, PharmD, [place] Clinical Pharmacist [phone] Please check AMION for all [facility] Pharmacy numbers 10/08/2022

## 2022-10-08 NOTE — Discharge Instructions (Signed)
You will take Aspirin for 1 month, then stop it (Aspirin 81 mg) You will take Brilinta 90 mg twice daily for one month and then change to a medication called Plavix. You will get a prescription for Plavix at your follow up appointment in 1-2 weeks.  You will need to stay on Eliquis for now. We will do a scan in a few months to see if you need to stay on it long term. Do not drive. Our office will arrange follow up in 1-2 weeks.   Information on my medicine - ELIQUIS (apixaban)  This medication education was reviewed with me or my healthcare representative as part of my discharge preparation.  The pharmacist that spoke with me during my hospital stay was:  Mosetta Anis, [facility]  Why was Eliquis prescribed for you? Eliquis was prescribed to treat blood clots that may have been found in the veins of your legs (deep vein thrombosis) or in your lungs (pulmonary embolism) and to reduce the risk of them occurring again.  What do You need to know about Eliquis ? The dose is ONE 5 mg tablet taken TWICE daily.  Eliquis may be taken with or without food.   Try to take the dose about the same time in the morning and in the evening. If you have difficulty swallowing the tablet whole please discuss with your pharmacist how to take the medication safely.  Take Eliquis exactly as prescribed and DO NOT stop taking Eliquis without talking to the doctor who prescribed the medication.  Stopping may increase your risk of developing a new blood clot.  Refill your prescription before you run out.  After discharge, you should have regular check-up appointments with your healthcare provider that is prescribing your Eliquis.    What do you do if you miss a dose? If a dose of ELIQUIS is not taken at the scheduled time, take it as soon as possible on the same day and twice-daily administration should be resumed. The dose should not be doubled to make up for a missed dose.  Important Safety Information A  possible side effect of Eliquis is bleeding. You should call your healthcare provider right away if you experience any of the following: Bleeding from an injury or your nose that does not stop. Unusual colored urine (red or dark brown) or unusual colored stools (red or black). Unusual bruising for unknown reasons. A serious fall or if you hit your head (even if there is no bleeding).  Some medicines may interact with Eliquis and might increase your risk of bleeding or clotting while on Eliquis. To help avoid this, consult your healthcare provider or pharmacist prior to using any new prescription or non-prescription medications, including herbals, vitamins, non-steroidal anti-inflammatory drugs (NSAIDs) and supplements.  This website has more information on Eliquis (apixaban): http://www.eliquis.com/eliquis/home

## 2022-10-08 NOTE — Progress Notes (Addendum)
Rounding Note    Patient Name: [name] Date of Encounter: 10/08/2022   [facility] Cardiologist:  Tresa EndoKelly  Subjective   No chest pain  Inpatient Medications    Scheduled Meds:  [START ON 10/09/2022] aspirin  81 mg Oral Daily   [MAR Hold] atorvastatin  80 mg Oral Daily   [MAR Hold] Chlorhexidine Gluconate Cloth  6 each Topical Daily   [MAR Hold] heparin  5,000 Units Subcutaneous Q8H   [MAR Hold] metoprolol succinate  50 mg Oral Daily   metoprolol succinate  50 mg Oral q AM   pantoprazole  40 mg Oral Daily   [MAR Hold] sodium chloride flush  3 mL Intravenous Q12H   sodium chloride flush  3 mL Intravenous Q12H   tamsulosin  0.4 mg Oral q AM   [MAR Hold] ticagrelor  90 mg Oral BID   Continuous Infusions:  [MAR Hold] sodium chloride     sodium chloride     sodium chloride 10 mL/hr at 10/08/22 0654   sodium chloride 75 mL/hr at 10/08/22 0914   [MAR Hold] nitroGLYCERIN Stopped (10/05/22 1306)   sodium chloride     PRN Meds: [MAR Hold] sodium chloride, sodium chloride, [MAR Hold] acetaminophen, fentaNYL, Heparin (Porcine) in NaCl, heparin sodium (porcine), hydrALAZINE, iohexol, labetalol, lidocaine (PF), midazolam, [MAR Hold] nitroGLYCERIN, nitroGLYCERIN, [MAR Hold] ondansetron (ZOFRAN) IV, sodium chloride, [MAR Hold] sodium chloride flush, sodium chloride flush   Vital Signs    Vitals:   10/08/22 0841 10/08/22 0846 10/08/22 0851 10/08/22 0905  BP: 114/88 119/84 119/84 115/75  Pulse: (!) 0 (!) 0 (!) 0 63  Resp:    10  Temp:      TempSrc:      SpO2:    100%  Weight:       No intake or output data in the 24 hours ending 10/08/22 0957     10/06/2022    8:01 PM 03/26/2014    8:53 AM 03/15/2014   12:58 PM  Last 3 Weights  Weight (lbs) 191 lb 9.6 oz 211 lb 10 oz 211 lb 10.3 oz  Weight (kg) 86.909 kg 95.992 kg 96 kg      Telemetry    Sinus - Personally Reviewed  ECG    Post PCI EKG today with sinus with non-specific T wave abnormality- Personally  Reviewed  Physical Exam   General: Well developed, well nourished, NAD  HEENT: OP clear, mucus membranes moist  SKIN: warm, dry. No rashes. Neuro: No focal deficits  Musculoskeletal: Muscle strength 5/5 all ext  Psychiatric: Mood and affect normal  Neck: No JVD, no carotid bruits, no thyromegaly, no lymphadenopathy.  Lungs:Clear bilaterally, no wheezes, rhonci, crackles Cardiovascular: Regular rate and rhythm. No murmurs, gallops or rubs. Abdomen:Soft. Bowel sounds present. Non-tender.  Extremities: No lower extremity edema. Pulses are 2 + in the bilateral DP/PT.  Labs    High Sensitivity Troponin:   Recent Labs  Lab 10/05/22 1040 10/05/22 1244 10/06/22 0219  TROPONINIHS 28* 14,613* >24,000*     Chemistry Recent Labs  Lab 10/05/22 1040 10/05/22 1044 10/06/22 0223 10/07/22 0203 10/08/22 0204  NA 138   < > 138 140 140  K 3.2*   < > 4.0 3.6 3.8  CL 109   < > 110 110 111  CO2 16*  --  20* 20* 20*  GLUCOSE 193*   < > 130* 109* 106*  BUN 14   < > 9 14 14   CREATININE 0.95   < > 0.88  1.09 0.97  CALCIUM 9.3  --  9.5 9.6 9.6  MG  --   --  1.8  --   --   PROT 6.8  --   --   --   --   ALBUMIN 4.1  --   --   --   --   AST 37  --   --   --   --   ALT 27  --   --   --   --   ALKPHOS 55  --   --   --   --   BILITOT 0.6  --   --   --   --   GFRNONAA >60  --  >60 >60 >60  ANIONGAP 13  --  8 10 9    < > = values in this interval not displayed.    Lipids  Recent Labs  Lab 10/05/22 1040  CHOL 245*  TRIG 122  HDL 59  LDLCALC 162*  CHOLHDL 4.2    Hematology Recent Labs  Lab 10/06/22 0223 10/07/22 0203 10/08/22 0204  WBC 10.2 9.6 10.6*  RBC 4.86 4.72 4.71  HGB 16.0 15.8 15.9  HCT 45.6 44.8 45.7  MCV 93.8 94.9 97.0  MCH 32.9 33.5 33.8  MCHC 35.1 35.3 34.8  RDW 14.3 14.6 14.5  PLT 166 155 150   Thyroid No results for input(s): "TSH", "FREET4" in the last 168 hours.  BNPNo results for input(s): "BNP", "PROBNP" in the last 168 hours.  DDimer No results for  input(s): "DDIMER" in the last 168 hours.   Radiology      Cardiac Studies   Echo 10/06/22:  1. Mid septum/anterior wall, apical segments, and apex are akinetic  consistent with large wrap around LAD infarction. No obvious LV thrombus  detected but contrast not used. Consider limited TTE with contrast given  extent of infarction. Left ventricular  ejection fraction, by estimation, is 25 to 30%. The left ventricle has  severely decreased function. The left ventricle demonstrates regional wall  motion abnormalities (see scoring diagram/findings for description). There  is mild concentric left  ventricular hypertrophy. Indeterminate diastolic filling due to E-A  fusion.   2. Right ventricular systolic function is normal. The right ventricular  size is normal. Tricuspid regurgitation signal is inadequate for assessing  PA pressure.   3. The mitral valve is grossly normal. No evidence of mitral valve  regurgitation. No evidence of mitral stenosis.   4. The aortic valve is tricuspid. There is mild calcification of the  aortic valve. Aortic valve regurgitation is mild. Aortic valve sclerosis  is present, with no evidence of aortic valve stenosis.   5. The inferior vena cava is normal in size with greater than 50%  respiratory variability, suggesting right atrial pressure of 3 mmHg.   Limited echo 10/07/22:   1. The left ventricle demonstrates regional wall motion abnormalities  (see scoring diagram/findings for description). LVEF 25-30% with LAD  territory WMA. No large LV thrombus. Cannot exclude small true apical LV  thrombus (Image 6). Consider CMR.   Cardiac cath 10/05/22:    Mid LAD-2 lesion is 20% stenosed.   Ost LAD to Mid LAD lesion is 100% stenosed.   Mid LAD-1 lesion is 10% stenosed.   2nd Mrg lesion is 90% stenosed.   A drug-eluting stent was successfully placed.   Post intervention, there is a 0% residual stenosis.   Acute ST segment elevation myocardial infarction  secondary to total very proximal occlusion of a  large dominant LAD with significant thrombus burden. Left circumflex vessel has 90% stenosis in a very large caliber vessel. Normal ramus intermediate and large dominant RCA. Moderate acute LV dysfunction with EF estimated at 35 to 40% with hypocontractility involving the mid distal anterolateral wall and apex.  LVEDP 27 mmHg. Difficult but successful PCI to the totally occluded proximal LAD with ultimate insertion of a 3.5 x 18 mm Medtronic Onyx Frontier stent postdilated to 3.8 mm with the 100% occlusion being reduced to 0% resumption of brisk TIMI-3 flow large LAD. Aggrastat bolus plus infusion for significant thrombus burden    Staged PCI 10/07/22:   Mid LAD-2 lesion is 20% stenosed.   Mid LAD-1 lesion is 10% stenosed.   Ost LAD to Mid LAD stent is widely patent.   2nd Mrg lesion is 90% stenosed.   A drug-eluting stent was successfully placed using a SYNERGY XD [id number] postdilated to greater than 3.75 mm and optimized with intravascular ultrasound..   Post intervention, there is a 0% residual stenosis.   LV end diastolic pressure is normal.   There is no aortic valve stenosis.   Patent LAD stent which was placed several days ago in the setting of anterior MI.  Successful PCI of the large OM1.  LVEDP was normal.  Continue dual antiplatelet therapy along with aggressive secondary prevention. Prior cath showed radial spasm and subsequent attempt of radial access led to vascular issue in the more proximal radial artery.  Therefore, we used the right groin.  The right common iliac is quite tortuous and required a versa core wire to navigate.  Patient Profile     76 y.o. male with history of HTN, GERD admitted with an anterior STEMI on 10/05/22. Dr. Tresa Endo performed his cardiac cath. He was found to have an occluded ostial LAD, treated with a drug eluting stent. Also with severe disease in the obtuse marginal branch. He has undergone staged PCI of  the obtuse marginal branch on 10/08/22. Echo with severe LV systolic dysfunction with LVEF=25-30% and possible small apical thrombus noted on echo. Anterior and apical wall hypokinesis c/w LAD territory MI.   Assessment & Plan   CAD/Anterior STEMI: He is doing well this morning post PCI of the obtuse marginal branch. No chest pain. We will plan to continue DAPT with ASA and Brilinta for one month. The ASA will be stopped after one month (See below--need for long term anti-coagulation). Continue Toprol and statin.    Ischemic cardiomyopathy: LVEF=25-30% by echo with akinesis of the anterior wall and apex. Limited echo yesterday with contrast to exclude LV thrombus. Possible small apical LV thrombus. His risk of thrombus is high given the apical akinesis. I think he should be started on anti-coagulation. Will plan to start IV heparin 8 hour post sheath pull today. If no bleeding from groin cath site overnight, he will need to be started on Eliquis tomorrow.  Continue beta blocker.  Add Losartan 25 mg po daily later today.  Given his severe LV systolic dysfunction post anterior MI, he will need a Lifevest before discharge.   LV thrombus: See above. Will start IV heparin today 8 hour post sheath pull. Will plan to start Eliquis tomorrow for LV thrombus. Even though the defect is small on the echo, he is at high risk to develop a larger thrombus there over the next 12 months if not anti-coagulated. While there are no large clinical trials to support the use of Eliquis over coumadin in treatment of LV thrombus,  I think clinically it makes much more sense to use Eliquis here. I think the safety profile of Eliquis is much better for him than coumadin in this setting.   HTN: BP is controlled. Will add low dose ARB later today.   30 minutes formulating plan and speaking with patient today. This is billed in addition to his cath charge today.   For questions or updates, please contact [facility] Please consult [email or web address] for contact info under    Signed, Verne Carrow, MD , [facility] 10/08/2022, 9:57 AM

## 2022-10-08 NOTE — Progress Notes (Signed)
   Patient admitted with anterior STEMI on 10/05/2022 and was found to have an occluded ostial LAD which was treated with DES. Also underwent staged PCI with DES to OM2 today. Complete Echo on 10/06/2022 showed LVEF of 25-30% with akinesis of mid septum, anterior wall, and apical segments. Repeat limited Echo on 10/07/2022 showed LVEF of 25-30% with akinesis of the mid and distal anterior septum, apical lateral segment, mid inferoseptal segment, apical anterior segment, and apical inferior segment. No large LV thrombus was seen but we could not exclude a small apical LV thrombus.   Plan is for LiveVest at discharge. Will place order and notify [employer] Rep.  Please see rounding note from Dr. Clifton James today for full recommendations/ plan.  Corrin Parker, PA-C 10/08/2022 11:39 AM

## 2022-10-08 NOTE — Progress Notes (Signed)
Site area: rt groin Site Prior to Removal:  Level 0 Pressure Applied For: 25 minutes Manual:   yes Patient Status During Pull:  stable Post Pull Site:  Level 0 Post Pull Instructions Given:  yes Post Pull Pulses Present: rt dp palpable Dressing Applied:  gauze and tegaderm Bedrest begins @ 1155 Comments:

## 2022-10-08 NOTE — Progress Notes (Signed)
CARDIAC REHAB PHASE I      Pt currently on bedrest post cath.  Education reviewed on heart attack, post wound education and restrictions, heart healthy diet, exercise, and newly educated about heart failure and wearing the life vest. Pt and significant other understand and all questions were answered and HF information book and handouts provided. Call bell in reach, no other needs expressed.    Service time is from 1430 to [address] RN, BSN 10/08/2022 2:48 PM

## 2022-10-08 NOTE — Progress Notes (Addendum)
Pt received from cath lab AxOx4, VS wnL and as per flow. (R) groin C/D/I level 0. Spouse and daughter bedside. All questions and concerns addressed. Call bell placed within reach, will continue to monitor and maintain safety.

## 2022-10-09 ENCOUNTER — Other Ambulatory Visit: Payer: Self-pay | Admitting: Physician Assistant

## 2022-10-09 DIAGNOSIS — Z95810 Presence of automatic (implantable) cardiac defibrillator: Secondary | ICD-10-CM | POA: Insufficient documentation

## 2022-10-09 DIAGNOSIS — I513 Intracardiac thrombosis, not elsewhere classified: Secondary | ICD-10-CM

## 2022-10-09 DIAGNOSIS — I429 Cardiomyopathy, unspecified: Secondary | ICD-10-CM | POA: Diagnosis not present

## 2022-10-09 DIAGNOSIS — I5020 Unspecified systolic (congestive) heart failure: Secondary | ICD-10-CM

## 2022-10-09 DIAGNOSIS — E7800 Pure hypercholesterolemia, unspecified: Secondary | ICD-10-CM | POA: Insufficient documentation

## 2022-10-09 DIAGNOSIS — E7841 Elevated Lipoprotein(a): Secondary | ICD-10-CM | POA: Insufficient documentation

## 2022-10-09 DIAGNOSIS — I2102 ST elevation (STEMI) myocardial infarction involving left anterior descending coronary artery: Secondary | ICD-10-CM | POA: Diagnosis not present

## 2022-10-09 DIAGNOSIS — I10 Essential (primary) hypertension: Secondary | ICD-10-CM | POA: Insufficient documentation

## 2022-10-09 DIAGNOSIS — I255 Ischemic cardiomyopathy: Secondary | ICD-10-CM | POA: Insufficient documentation

## 2022-10-09 LAB — CBC
HCT: 42.8 % (ref 39.0–52.0)
Hemoglobin: 14.8 g/dL (ref 13.0–17.0)
MCH: 33.1 pg (ref 26.0–34.0)
MCHC: 34.6 g/dL (ref 30.0–36.0)
MCV: 95.7 fL (ref 80.0–100.0)
Platelets: 132 10*3/uL — ABNORMAL LOW (ref 150–400)
RBC: 4.47 MIL/uL (ref 4.22–5.81)
RDW: 14.1 % (ref 11.5–15.5)
WBC: 8.2 10*3/uL (ref 4.0–10.5)
nRBC: 0 % (ref 0.0–0.2)

## 2022-10-09 LAB — BASIC METABOLIC PANEL
Anion gap: 8 (ref 5–15)
BUN: 11 mg/dL (ref 8–23)
CO2: 21 mmol/L — ABNORMAL LOW (ref 22–32)
Calcium: 9.3 mg/dL (ref 8.9–10.3)
Chloride: 108 mmol/L (ref 98–111)
Creatinine, Ser: 1 mg/dL (ref 0.61–1.24)
GFR, Estimated: >60 mL/min (ref >60)
Glucose, Bld: 129 mg/dL — ABNORMAL HIGH (ref 70–99)
Potassium: 3.6 mmol/L (ref 3.5–5.1)
Sodium: 137 mmol/L (ref 135–145)

## 2022-10-09 LAB — HEPARIN LEVEL (UNFRACTIONATED): Heparin Unfractionated: 0.59 IU/mL (ref 0.30–0.70)

## 2022-10-09 MED ORDER — APIXABAN 5 MG PO TABS
5.0000 mg | ORAL_TABLET | Freq: Two times a day (BID) | ORAL | Status: DC
  Administered 2022-10-09: 5 mg via ORAL
  Filled 2022-10-09: qty 1

## 2022-10-09 MED ORDER — TICAGRELOR 90 MG PO TABS: 90.0000 mg | ORAL_TABLET | Freq: Two times a day (BID) | ORAL | 1 refills | Status: DC

## 2022-10-09 MED ORDER — SPIRONOLACTONE 25 MG PO TABS: 25.0000 mg | ORAL_TABLET | Freq: Every day | ORAL | 3 refills | Status: AC

## 2022-10-09 MED ORDER — ASPIRIN 81 MG PO CHEW: 81.0000 mg | CHEWABLE_TABLET | Freq: Every day | ORAL | Status: DC

## 2022-10-09 MED ORDER — ACETAMINOPHEN 325 MG PO TABS: 650.0000 mg | ORAL_TABLET | ORAL | Status: AC | PRN

## 2022-10-09 MED ORDER — LOSARTAN POTASSIUM 25 MG PO TABS: 25.0000 mg | ORAL_TABLET | Freq: Every day | ORAL | 3 refills | Status: AC

## 2022-10-09 MED ORDER — NITROGLYCERIN 0.4 MG SL SUBL: 0.4000 mg | SUBLINGUAL_TABLET | SUBLINGUAL | 12 refills | Status: AC | PRN

## 2022-10-09 MED ORDER — APIXABAN 5 MG PO TABS: 5.0000 mg | ORAL_TABLET | Freq: Two times a day (BID) | ORAL | 3 refills | Status: DC

## 2022-10-09 MED ORDER — ATORVASTATIN CALCIUM 80 MG PO TABS: 80.0000 mg | ORAL_TABLET | Freq: Every day | ORAL | 3 refills | Status: AC

## 2022-10-09 NOTE — TOC Progression Note (Signed)
Transition of Care [facility]) - Progression Note    Patient Details  Name: [name] MRN: [id number] Date of Birth: Aug 04, 1946  Transition of Care [facility] CM/SW Contact  Bess Kinds, RN Phone Number: [phone] 10/09/2022, 11:02 AM  Clinical Narrative:     Received confirmation from [place] at [place] that all clinical documents needed have been received.   Expected Discharge Plan: Home/Self Care Barriers to Discharge: Continued Medical Work up  Expected Discharge Plan and Services Expected Discharge Plan: Home/Self Care In-house Referral: NA Discharge Planning Services: CM Consult Post Acute Care Choice: Durable Medical Equipment Living arrangements for the past 2 months: Single Family Home                 DME Arranged: Life vest DME Agency: [employer] Date DME Agency Contacted: 10/08/22 Time DME Agency Contacted: 1535 Representative spoke with at DME Agency: Luberta Robertson HH Arranged: NA HH Agency: NA         Social Determinants of Health (SDOH) Interventions Food Insecurity Interventions: Intervention Not Indicated Housing Interventions: Intervention Not Indicated Transportation Interventions: Intervention Not Indicated Utilities Interventions: Intervention Not Indicated Alcohol Usage Interventions: Intervention Not Indicated (Score <7) Financial Strain Interventions: Intervention Not Indicated  Readmission Risk Interventions     No data to display

## 2022-10-09 NOTE — Progress Notes (Signed)
Patient have been having loose bowel movements for 2 days. Patient had at least four watery stools throughout the day and two over the night. Lendell Caprice MD with cardiology was notified. MD advised a Cdiff lab order and imodium if Cdiff is negative. Specimen collected but no active order in at this time. Will pass patient concerns to day shift nurse in handoff.

## 2022-10-09 NOTE — Discharge Summary (Addendum)
Discharge Summary    Patient ID: [name] MRN: [id number]; DOB: 07-24-1946  Admit date: 10/05/2022 Discharge date: 10/09/2022  PCP:  Cammy Copa, MD   [place] [facility] Cardiologist:  Shelva Majestic, MD        Discharge Diagnoses    Principal Problem:   STEMI involving left anterior descending coronary artery [facility]) Active Problems:   HFrEF (heart failure with reduced ejection fraction) ([place])   Ischemic cardiomyopathy   Left ventricular apical thrombus   Pure hypercholesterolemia   Essential hypertension   Elevated Lp(a)   Uses LifeVest defibrillator    Diagnostic Studies/Procedures    Cardiac catheterization 10/05/2022 LAD ostial 100; OM2 90 LVEDP 27, EF 35-40 with anterolateral and apical HK PCI: 3.5 x 18 mm Onyx Frontier DES to the LAD (D1 jailed)  Echocardiogram 10/06/2022 Mid septum/anterior wall, apical and apex akinetic, EF 25-30, mild concentric LVH, normal RVSF, mild AI, AV sclerosis without stenosis, RAP 3  Cardiac catheterization 10/07/2022 Failed access due to right radial artery perforation.  Procedure aborted  Limited echocardiogram with Definity contrast 10/07/2022 EF 25-30, no large LV thrombus, cannot exclude small true apical LV thrombus, mid and distal anterior septum, apical lateral segment, mid inferoseptal segment, apical anterior segment, apical inferior segment-akinetic  Cardiac catheterization 10/08/2022 LAD stent patent PCI: 3.5 x 16 mm Synergy DES to OM2  _____________   History of Present Illness     [name] is a 76 y.o. male from [place], [place] with history of hypertension, GERD, BPH.  He developed chest discomfort while deer hunting.  He drove to a nearby fire station and an EKG was concerning for anterior lateral STEMI.  He was transported by EMS to [facility] Course     Consultants:     He was taken emergently to the cardiac catheterization lab after arrival to [facility].  He underwent cardiac catheterization by Dr. Claiborne Billings.  This demonstrated total occlusion of his proximal LAD.  He underwent PCI with placement of a 3.5 x 18 mm Onyx DES to the LAD which jailed the large proximal D1.  LVEDP was elevated.  He had residual 90% stenosis in the LCx.  He went back to the Cath Lab for attempted staged PCI of the LCx on 10/06/2022.  However, the procedure was aborted due to difficulty with accessing the right radial artery.  Echocardiogram demonstrated severe LV dysfunction with EF 25-30%.  There was akinesis of the anterior wall and apex. He underwent staged PCI with a 3.5 x 16 mm Synergy DES to a large OM1. Of note, he had spasm in the radial artery and his case was done via the right femoral artery.  The LAD stent was patent.  He had a repeat limited echo with contrast on 10/07/2022 which demonstrated possible small apical LV thrombus.  It was felt that his risk of thrombus is high given apical akinesis.  It was decided to start the patient on anticoagulation.  Although there are no large clinical trials to support the use of Eliquis over Coumadin in the treatment of LV thrombus, Dr. Angelena Form felt that, clinically, it makes more sense to use Eliquis for this patient.  It was also felt that he would need a LifeVest prior to discharge.  He also recommended aspirin plus Brilinta for 1 month. She reviewed the patient with Dr. Claiborne Billings who recommended that after 1 month, he will DC aspirin and change Brilinta to Plavix. The patient will need Plavix prescribed  at his follow up visit. He will remain on Plavix + Eliquis for a minimum of 1 year. He will need a f/u study (preferably Cardiac CT) in 6 mos to determine if he needs to remain on anticoagulation with Eliquis. He was started on guideline directed medical therapy for HFrEF with losartan, metoprolol succinate.  Spironolactone 25 mg once daily was also added at discharge. He will need a BMET at f/u. The patient did have some episodes  of diarrhea during his admission.  He had no fever, elevated white count or abdominal bloating/pain.  His diarrhea was resolved prior to discharge.  He plans to follow-up at least once with our team in [place].  He would like to continue to follow-up with [place] long-term if his insurance will let him. His LifeVest was placed prior to DC. He is stable for DC to home.      Did the patient have an acute coronary syndrome (MI, NSTEMI, STEMI, etc) this admission?:  Yes                               AHA/ACC Clinical Performance & Quality Measures: Aspirin prescribed? - Yes ADP Receptor Inhibitor (Plavix/Clopidogrel, Brilinta/Ticagrelor or Effient/Prasugrel) prescribed (includes medically managed patients)? - Yes Beta Blocker prescribed? - Yes High Intensity Statin (Lipitor 40-97m or Crestor 20-461m prescribed? - Yes EF assessed during THIS hospitalization? - Yes For EF <40%, was ACEI/ARB prescribed? - Yes For EF <40%, Aldosterone Antagonist (Spironolactone or Eplerenone) prescribed? - Yes Cardiac Rehab Phase II ordered (including medically managed patients)? - Yes      The patient will be scheduled for a TOC follow up appointment in 7 days.  A [facility] at the office where the patient should be seen for follow up.  _____________  Discharge Vitals Blood pressure 130/81, pulse (!) 109, temperature 98.9 F (37.2 C), temperature source Oral, resp. rate 16, weight 86.9 kg, SpO2 100 %.  Filed Weights   10/06/22 2001  Weight: 86.9 kg    Labs & Radiologic Studies    CBC Recent Labs    10/08/22 0204 10/09/22 0606  WBC 10.6* 8.2  HGB 15.9 14.8  HCT 45.7 42.8  MCV 97.0 95.7  PLT 150 13294  Basic Metabolic Panel Recent Labs    10/08/22 0204 10/09/22 0606  NA 140 137  K 3.8 3.6  CL 111 108  CO2 20* 21*  GLUCOSE 106* 129*  BUN 14 11  CREATININE 0.97 1.00  CALCIUM 9.6 9.3    High Sensitivity Troponin:   Recent Labs  Lab  10/05/22 1040 10/05/22 1244 10/06/22 0219  TROPONINIHS 28* 14,613* >24,000*    Lipid Panel 10/05/2022: Total cholesterol 245, HDL 59, LDL 162, triglycerides 122  LP(a) 10/06/2022 79.4  Hgb A1c 10/05/22 5.7 _____________    Disposition   Pt is being discharged home today in good condition.  Follow-up Plans & Appointments     Follow-up Information     KeTroy SineMD Follow up in 1 week(s).   Specialty: Cardiology Why: The office will call to arrange a follow up with Dr. KeClaiborne Billingsr one of the PAs or NPs. Contact information: [address][phone]                Discharge Instructions     AMB Referral to Cardiac Rehabilitation - Phase II   Complete  by: As directed    Diagnosis: Coronary Stents   After initial evaluation and assessments completed: Virtual Based Care may be provided alone or in conjunction with Phase 2 Cardiac Rehab based on patient barriers.: Yes   Intensive Cardiac Rehabilitation (ICR) [place] location only OR Traditional Cardiac Rehabilitation (TCR) *If criteria for ICR are not met will enroll in TCR [facility] only): Yes   AMB referral to cardiac rehabilitation   Complete by: As directed    Pt would like to have a referral to [facility] in [place]   Diagnosis:  Coronary Stents STEMI     After initial evaluation and assessments completed: Virtual Based Care may be provided alone or in conjunction with Phase 2 Cardiac Rehab based on patient barriers.: Yes   Intensive Cardiac Rehabilitation (ICR) [place] location only OR Traditional Cardiac Rehabilitation (TCR) *If criteria for ICR are not met will enroll in TCR [facility] only): Yes   Diet - low sodium heart healthy   Complete by: As directed    Discharge wound care:   Complete by: As directed    Call for any swelling, bleeding, bruising, fever ([phone])   Driving Restrictions   Complete by: As directed    Do not drive.   Increase activity slowly    Complete by: As directed    Lifting restrictions   Complete by: As directed    Do not lift over 5 lbs.   Sexual Activity Restrictions   Complete by: As directed    None for now        Discharge Medications   Allergies as of 10/09/2022   No Known Allergies      Medication List     STOP taking these medications    Azor 10-40 MG tablet Generic drug: amLODipine-olmesartan   naproxen sodium 220 MG tablet Commonly known as: ALEVE       TAKE these medications    acetaminophen 325 MG tablet Commonly known as: TYLENOL Take 2 tablets (650 mg total) by mouth every 4 (four) hours as needed for headache or mild pain.   apixaban 5 MG Tabs tablet Commonly known as: ELIQUIS Take 1 tablet (5 mg total) by mouth 2 (two) times daily.   aspirin 81 MG chewable tablet Chew 1 tablet (81 mg total) by mouth daily. Start taking on: October 10, 2022   atorvastatin 80 MG tablet Commonly known as: LIPITOR Take 1 tablet (80 mg total) by mouth daily. Start taking on: October 10, 2022   esomeprazole 40 MG capsule Commonly known as: NEXIUM Take 40 mg by mouth daily as needed (for reflux).   losartan 25 MG tablet Commonly known as: COZAAR Take 1 tablet (25 mg total) by mouth daily. Start taking on: October 10, 2022   metoprolol succinate 50 MG 24 hr tablet Commonly known as: TOPROL-XL Take 50 mg by mouth in the morning. Take with or immediately following a meal.   nitroGLYCERIN 0.4 MG SL tablet Commonly known as: NITROSTAT Place 1 tablet (0.4 mg total) under the tongue every 5 (five) minutes as needed for chest pain.   spironolactone 25 MG tablet Commonly known as: ALDACTONE Take 1 tablet (25 mg total) by mouth daily.   tamsulosin 0.4 MG Caps capsule Commonly known as: FLOMAX Take 0.4 mg by mouth in the morning.   ticagrelor 90 MG Tabs tablet Commonly known as: BRILINTA Take 1 tablet (90 mg total) by mouth 2 (two) times daily.   VISINE OP Place 1 drop into both eyes  2 (two) times daily as needed (dry eyes).               Durable Medical Equipment  (From admission, onward)           Start     Ordered   10/08/22 1141  For home use only DME Vest life vest  Once       Comments: Duration 3 months.   10/08/22 1140              Discharge Care Instructions  (From admission, onward)           Start     Ordered   10/09/22 0000  Discharge wound care:       Comments: Call for any swelling, bleeding, bruising, fever [phone])   10/09/22 1229               Outstanding Labs/Studies   BMET at follow up visit.  !!Patient needs Plavix Rx at follow up office visit!!  Duration of Discharge Encounter   93 minutes including physician time.  Signed, Jabari Swoveland Fidel Levy, MD [place]

## 2022-10-09 NOTE — Plan of Care (Signed)

## 2022-10-09 NOTE — Progress Notes (Signed)
Progress Note  Patient Name: [name] Date of Encounter: 10/09/2022  Primary Cardiologist: None  Subjective   No acute events overnight.  No chest pain after the procedure. No bleeding either.  He was noted to have diarrhea overnight and C. difficile panel was ordered. However he did not have any abdominal pain/elevated leukocyte count. Will discontinue C. difficile panel.  He does not have any more diarrhea episodes.  Inpatient Medications    Scheduled Meds:  apixaban  5 mg Oral BID   aspirin  81 mg Oral Daily   atorvastatin  80 mg Oral Daily   Chlorhexidine Gluconate Cloth  6 each Topical Daily   losartan  25 mg Oral Daily   metoprolol succinate  50 mg Oral Daily   pantoprazole  40 mg Oral Daily   sodium chloride flush  3 mL Intravenous Q12H   sodium chloride flush  3 mL Intravenous Q12H   tamsulosin  0.4 mg Oral q AM   ticagrelor  90 mg Oral BID   Continuous Infusions:  sodium chloride     sodium chloride     nitroGLYCERIN Stopped (10/05/22 1306)   PRN Meds: sodium chloride, sodium chloride, acetaminophen, nitroGLYCERIN, ondansetron (ZOFRAN) IV, sodium chloride flush, sodium chloride flush   Vital Signs    Vitals:   10/08/22 1437 10/08/22 1953 10/09/22 0408 10/09/22 0951  BP: 126/79 122/76 108/75 130/81  Pulse: 76 80 72 (!) 109  Resp: 17 16 16    Temp: 98.7 F (37.1 C) 98.4 F (36.9 C) 98.9 F (37.2 C)   TempSrc: Axillary Oral Oral   SpO2: 99% 96% 100%   Weight:        Intake/Output Summary (Last 24 hours) at 10/09/2022 1153 Last data filed at 10/09/2022 0200 Gross per 24 hour  Intake 47.23 ml  Output --  Net 47.23 ml   Filed Weights   10/06/22 2001  Weight: 86.9 kg    Telemetry     Personally reviewed, NSR and HR controlled  ECG   NSR  Physical Exam   GEN: No acute distress.   Neck: No JVD. Cardiac: RRR, no murmur, rub, or gallop.  Respiratory: Nonlabored. Clear to auscultation bilaterally. GI: Soft, nontender, bowel sounds  present. MS: No edema; No deformity. Neuro:  Nonfocal. Psych: Alert and oriented x 3. Normal affect.  Labs    Chemistry Recent Labs  Lab 10/05/22 1040 10/05/22 1044 10/07/22 0203 10/08/22 0204 10/09/22 0606  NA 138   < > 140 140 137  K 3.2*   < > 3.6 3.8 3.6  CL 109   < > 110 111 108  CO2 16*   < > 20* 20* 21*  GLUCOSE 193*   < > 109* 106* 129*  BUN 14   < > 14 14 11   CREATININE 0.95   < > 1.09 0.97 1.00  CALCIUM 9.3   < > 9.6 9.6 9.3  PROT 6.8  --   --   --   --   ALBUMIN 4.1  --   --   --   --   AST 37  --   --   --   --   ALT 27  --   --   --   --   ALKPHOS 55  --   --   --   --   BILITOT 0.6  --   --   --   --   GFRNONAA >60   < > >60 >60 >60  ANIONGAP 13   < > 10 9 8    < > = values in this interval not displayed.     Hematology Recent Labs  Lab 10/07/22 0203 10/08/22 0204 10/09/22 0606  WBC 9.6 10.6* 8.2  RBC 4.72 4.71 4.47  HGB 15.8 15.9 14.8  HCT 44.8 45.7 42.8  MCV 94.9 97.0 95.7  MCH 33.5 33.8 33.1  MCHC 35.3 34.8 34.6  RDW 14.6 14.5 14.1  PLT 155 150 132*    Cardiac Enzymes Recent Labs  Lab 10/05/22 1040 10/05/22 1244 10/06/22 0219  TROPONINIHS 28* 14,613* >24,000*    BNPNo results for input(s): "BNP", "PROBNP" in the last 168 hours.   DDimerNo results for input(s): "DDIMER" in the last 168 hours.   Cardiac studies    CARDIAC CATHETERIZATION  Result Date: 10/08/2022   Mid LAD-2 lesion is 20% stenosed.   Mid LAD-1 lesion is 10% stenosed.   Ost LAD to Mid LAD stent is widely patent.   2nd Mrg lesion is 90% stenosed.   A drug-eluting stent was successfully placed using a SYNERGY XD 3.50X16 postdilated to greater than 3.75 mm and optimized with intravascular ultrasound..   Post intervention, there is a 0% residual stenosis.   LV end diastolic pressure is normal.   There is no aortic valve stenosis. Patent LAD stent which was placed several days ago in the setting of anterior MI.  Successful PCI of the large OM1.  LVEDP was normal.  Continue  dual antiplatelet therapy along with aggressive secondary prevention. Prior cath showed radial spasm and subsequent attempt of radial access led to vascular issue in the more proximal radial artery.  Therefore, we used the right groin.  The right common iliac is quite tortuous and required a versa core wire to navigate.   ECHOCARDIOGRAM LIMITED  Result Date: 10/07/2022    ECHOCARDIOGRAM LIMITED REPORT   Patient Name:   [name] Date of Exam: 10/07/2022 Medical Rec #:  10/09/2022     Height:       68.0 in Accession #:    [id number]    Weight:       191.6 lb Date of Birth:  June 12, 1946     BSA:          2.007 m Patient Age:    76 years      BP:           126/96 mmHg Patient Gender: M             HR:           60 bpm. Exam Location:  Inpatient Procedure: Limited Echo and Intracardiac Opacification Agent Indications:    Myocardial infarctin  History:        Patient has prior history of Echocardiogram examinations. Acute                 MI; Risk Factors:Hypertension and Former Smoker. GERD.  Sonographer:    09/25/1946 RDCS (AE) Referring Phys: [phone] LINDSAY B ROBERTS IMPRESSIONS  1. The left ventricle demonstrates regional wall motion abnormalities (see scoring diagram/findings for description). LVEF 25-30% with LAD territory WMA. No large LV thrombus. Cannot exclude small true apical LV thrombus (Image 6). Consider CMR. FINDINGS  Left Ventricle: The left ventricle demonstrates regional wall motion abnormalities. Definity contrast agent was given IV to delineate the left ventricular endocardial borders.  LV Wall Scoring: The mid and distal anterior septum, apical lateral segment, mid inferoseptal segment, apical anterior segment, and apical inferior segment are akinetic.  Riley Lam MD Electronically signed by Riley Lam MD Signature Date/Time: 10/07/2022/5:53:35 PM    Final      Assessment & Plan    Patient is a 76 year old M known to have HTN, GERD was admitted with an anterior STEMI on  10/05/2022 s/p LAD PCI and staged PCI of OM on 10/08/2022.  Echo showed LVEF 25 to 30% with RWMA and small apical thrombus was not able to be ruled out.  # CAD manifested by anterior STEMI on 10/05/2022 s/p LAD PCI with staged PCI of OM1 on 10/08/2022 and LVEF 25 to 30% with small apical thrombus, currently angina free -Continue triple therapy (aspirin 81 mg, Brilinta 90 mg twice daily, Eliquis 5 mg twice daily) for 1 month. Then drop aspirin and brillinta. Start Plavix 75mg  once daily with Eliquis 5 mg twice daily for 1 year. Eliquis will be dropped after ruling out LV apical thrombus (preferably by CT cardiac or echo) after 6 months. At that time, aspirin 81 mg once daily will be added to the Plavix. -Continue atorvastatin 80 mg nightly -Continue metoprolol succinate 50 mg once daily -Continue losartan 25 mg once daily -Start spironolactone 25 mg once daily upon discharge -Cardiology follow-up in 2 weeks -Patient has LifeVest on  # Ischemic cardiomyopathy, LVEF 25 to 30% with RWMA -Continue metoprolol succinate 50 mg once daily, losartan 25 mg once daily and start spironolactone 25 mg once daily upon discharge.  BMP in 1 week. -Repeat echo in 3 months after optimization of GDMT regimen.  # Small apical LV thrombus -Heparin drip switched to Eliquis 5 mg twice daily. Continue for 6 months and repeat CT scan/Echo to definitely rule out apical thrombus.  Disposition: Discharge to home today with LifeVest  I have spent a total of 33 minutes with patient reviewing chart , telemetry, EKGs, labs and examining patient as well as establishing an assessment and plan that was discussed with the patient.  > 50% of time was spent in direct patient care.     Signed, , MD  10/09/2022, 11:53 AM

## 2022-10-09 NOTE — Progress Notes (Signed)
ANTICOAGULATION CONSULT NOTE  Pharmacy Consult for heparin Indication: LV thrombus  No Known Allergies  Patient Measurements: Weight: 86.9 kg (191 lb 9.6 oz) Heparin Dosing Weight: 87 kg  Vital Signs: Temp: 98.9 F (37.2 C) (12/16 0408) Temp Source: Oral (12/16 0408) BP: 108/75 (12/16 0408) Pulse Rate: 72 (12/16 0408)  Labs: Recent Labs    10/07/22 0203 10/08/22 0204 10/09/22 0606  HGB 15.8 15.9 14.8  HCT 44.8 45.7 42.8  PLT 155 150 132*  HEPARINUNFRC  --   --  0.59  CREATININE 1.09 0.97  --      CrCl cannot be calculated (Unknown ideal weight.).   Medical History: Past Medical History:  Diagnosis Date   GERD (gastroesophageal reflux disease)    H/O hiatal hernia    Hypertension     Assessment: 71 yoM admitted with STEMI. Pt noted to have small LV thrombus on ECHO so IV heparin to resume 8h after sheath removal. Will transition to apixaban tomorrow pending vascular access sites. CBC wnl.  12/16 AM update:  Heparin level therapeutic   Goal of Therapy:  Heparin level 0.3-0.7 units/ml Monitor platelets by anticoagulation protocol: Yes   Plan:  Cont heparin at 1300 units/hr 1400 heparin level  Abran Duke, PharmD, BCPS Clinical Pharmacist Phone: [phone]

## 2022-10-09 NOTE — Progress Notes (Signed)
CARDIAC REHAB PHASE I   PRE:  Rate/Rhythm: NSR 72   BP:  Sitting: 130/88      SaO2: 98  MODE:  Ambulation: 470 ft   POST:  Rate/Rhythm: NSR 91  BP:  Sitting: 151/95   BP Re-check 130/81      SaO2: 100%  Pt received in bed and agrees to ambulate. Pt has IV heparin infusing well. Pt ambulated with steady gait. No complaints of SOB, dizziness, or pain. Pt back to bed for MD to examine groin. Pt moving in bed and BP read 151/95. BP re-check preformed. Call bell and tray table in reach. IV in place and infusing well.   Lorin Picket, MS, ACSM EP-C, [facility] 10/09/2022 9:05 - 9:34

## 2022-10-11 ENCOUNTER — Telehealth: Payer: Self-pay

## 2022-10-11 ENCOUNTER — Telehealth: Payer: Self-pay | Admitting: Cardiovascular Disease

## 2022-10-11 ENCOUNTER — Other Ambulatory Visit: Payer: Self-pay

## 2022-10-11 DIAGNOSIS — I5020 Unspecified systolic (congestive) heart failure: Secondary | ICD-10-CM

## 2022-10-11 NOTE — Telephone Encounter (Addendum)
Daughter stated patient had nose bleed and dark bloody urine while in hospital. This AM, urine pink-tinged. Patient lightheaded. Dr. Tresa Endo advised. Horder that if urine becomes darker for patient to go to the ED and to keep appointment on Friday. When I called daughter back, she said patient reported urine dark red all day. Patient is staying hydrated. Went back to Dr. Tresa Endo. He ordered for patient to omit tonight's dose of eliquis and to go to ED is blood in urine increases. Patient is on aspirin, brilinta and eliquis for one month, then will change to Plavix and eliquis and d/c aspirin. BP while on phone 120/74, P 53. Education of fall precautions.

## 2022-10-11 NOTE — Telephone Encounter (Signed)
Agree with plan. Will collect CBC/BMP at time of OV.   Alver Sorrow, NP

## 2022-10-11 NOTE — Telephone Encounter (Signed)
Patient contacted regarding discharge from [facility] on 12/16.Marland Kitchen  Patient understands to follow up with provider C. Walker on 12/22 at 2:20 at [facility]. Patient understands discharge instructions? Yes Patient understands medications and regiment? yes Patient understands to bring all medications to this visit? Yes  Ask patient:  Are you enrolled in My Chart? Yes  Patient stated his rt groin feels fines. Dressing D&I. He stated he will removed the dressing today. He will contact clinic if the site starts to bleed or swell.

## 2022-10-11 NOTE — Telephone Encounter (Signed)
-----   Message from Beatrice Lecher, [place] sent at 10/09/2022 12:35 PM EST ----- Regarding: Hosp FU Primary Cardiologist:  Tresa Endo DC from hospital on 10/09/2022  Please arrange FU in 1 week with Tresa Endo or PA/NP. This is a TCM appointment. He needs a BMET at his office visit. Lab is ordered in Epic.  Please put in appt notes "Patient will need Rx for Plavix" Signed,  Tereso Newcomer, PA-C   10/09/2022 12:35 PM

## 2022-10-11 NOTE — Telephone Encounter (Signed)
Patient is in because he has blood in urine and is nose has been bleeding. Also been a little lightheaded. Please advise

## 2022-10-15 ENCOUNTER — Encounter (HOSPITAL_BASED_OUTPATIENT_CLINIC_OR_DEPARTMENT_OTHER): Payer: Self-pay | Admitting: Family

## 2022-10-15 ENCOUNTER — Telehealth (HOSPITAL_COMMUNITY): Payer: Self-pay

## 2022-10-15 ENCOUNTER — Ambulatory Visit (HOSPITAL_BASED_OUTPATIENT_CLINIC_OR_DEPARTMENT_OTHER): Payer: Medicare Other | Admitting: Family

## 2022-10-15 VITALS — BP 120/70 | HR 70 | Ht 68.0 in | Wt 191.0 lb

## 2022-10-15 DIAGNOSIS — I513 Intracardiac thrombosis, not elsewhere classified: Secondary | ICD-10-CM

## 2022-10-15 DIAGNOSIS — I493 Ventricular premature depolarization: Secondary | ICD-10-CM

## 2022-10-15 DIAGNOSIS — I255 Ischemic cardiomyopathy: Secondary | ICD-10-CM

## 2022-10-15 DIAGNOSIS — E785 Hyperlipidemia, unspecified: Secondary | ICD-10-CM | POA: Diagnosis not present

## 2022-10-15 DIAGNOSIS — I25118 Atherosclerotic heart disease of native coronary artery with other forms of angina pectoris: Secondary | ICD-10-CM

## 2022-10-15 DIAGNOSIS — I5022 Chronic systolic (congestive) heart failure: Secondary | ICD-10-CM | POA: Diagnosis not present

## 2022-10-15 DIAGNOSIS — D6859 Other primary thrombophilia: Secondary | ICD-10-CM

## 2022-10-15 DIAGNOSIS — K219 Gastro-esophageal reflux disease without esophagitis: Secondary | ICD-10-CM | POA: Diagnosis not present

## 2022-10-15 MED ORDER — CLOPIDOGREL BISULFATE 75 MG PO TABS: 75.0000 mg | ORAL_TABLET | Freq: Every day | ORAL | 3 refills | Status: AC

## 2022-10-15 MED ORDER — PANTOPRAZOLE SODIUM 20 MG PO TBEC: 20.0000 mg | DELAYED_RELEASE_TABLET | Freq: Every day | ORAL | 3 refills | Status: AC

## 2022-10-15 NOTE — Telephone Encounter (Signed)
Phase II referral for Cardiac Rehab faxed to [facility].

## 2022-10-15 NOTE — Patient Instructions (Addendum)
Medication Instructions:  Your physician has recommended you make the following change in your medication:   STOP Aspirin, Nexium  CONTINUE Brilinta for 30 days When you run out of Brilinta... START Clopidogrel (Plavix) 75mg  daily  CONTINUE Eliquis  STOP Nexium (because it interacts with Plavix)  START Pantoprazole (Protonix) 20mg  daily  *If you need a refill on your cardiac medications before your next appointment, please call your pharmacy*   Lab Work: Your physician recommends that you return for lab work today: BMP, CBC  Your physician recommends that you return for lab work in 6 weeks for fasting lipid panel and liver function tests.   If you have labs (blood work) drawn today and your tests are completely normal, you will receive your results only by: MyChart Message (if you have MyChart) OR A paper copy in the mail If you have any lab test that is abnormal or we need to change your treatment, we will call you to review the results.   Testing/Procedures: Your EKG today was stable which is a good result.   We will plan for echocardiogram in 3 months and CT scan in 6 months. We can discuss this at follow up.   Follow-Up: At [facility], you and your health needs are our priority.  As part of our continuing mission to provide you with exceptional heart care, we have created designated Provider Care Teams.  These Care Teams include your primary Cardiologist (physician) and Advanced Practice Providers (APPs -  Physician Assistants and Nurse Practitioners) who all work together to provide you with the care you need, when you need it.  We recommend signing up for the patient portal called "MyChart".  Sign up information is provided on this After Visit Summary.  MyChart is used to connect with patients for Virtual Visits (Telemedicine).  Patients are able to view lab/test results, encounter notes, upcoming appointments, etc.  Non-urgent messages can be sent to your  provider as well.   To learn more about what you can do with MyChart, go to .    Your next appointment:   3 week(s)  The format for your next appointment:   Virtual Visit   Provider:   [facility], NP    Other Instructions  For coronary artery disease often called "heart disease" we aim for optimal guideline directed medical therapy. We use the "A, B, C"s to help keep [email or web address] on track!  A = Aspirin 81mg  daily *we have STOPPED this for the time being due to bleeding* B = Beta blocker which helps to relax the heart. This is your Metoprolol. C = Cholesterol control. You take Atorvastatin to help control your cholesterol.  D = Don't forget nitroglycerin! This is an emergency tablet to be used if you have chest pain. E = Extras. In your case, this is Brilinta to help protect your stent.    Heart Healthy Diet Recommendations: A low-salt diet is recommended. Meats should be grilled, baked, or boiled. Avoid fried foods. Focus on lean protein sources like fish or chicken with vegetables and fruits. The American Heart Association is a Gillian Shields!  American Heart Association Diet and Lifeystyle Recommendations   Exercise recommendations: The American Heart Association recommends 150 minutes of moderate intensity exercise weekly. Try 30 minutes of moderate intensity exercise 4-5 times per week. This could include walking, jogging, or swimming. Continue to gradually increase activity

## 2022-10-15 NOTE — Progress Notes (Unsigned)
Office Visit    Patient Name: [name] Date of Encounter: 10/19/2022  PCP:  Grover Canavan, MD    [facility]  Cardiologist:  Nicki Guadalajara, MD  Advanced Practice Provider:  No care team member to display Electrophysiologist:  None      Chief Complaint    [name] is a 76 y.o. male presents today for follow up after [facility]   Past Medical History    Past Medical History:  Diagnosis Date   GERD (gastroesophageal reflux disease)    H/O hiatal hernia    Hypertension    Past Surgical History:  Procedure Laterality Date   CORONARY BALLOON ANGIOPLASTY N/A 10/07/2022   Procedure: CORONARY BALLOON ANGIOPLASTY;  Surgeon: Swaziland, Peter M, MD;  Location: [facility] INVASIVE CV LAB;  Service: Cardiovascular;  Laterality: N/A;   CORONARY STENT INTERVENTION N/A 10/05/2022   Procedure: CORONARY STENT INTERVENTION;  Surgeon: Lennette Bihari, MD;  Location: [facility] INVASIVE CV LAB;  Service: Cardiovascular;  Laterality: N/A;   CORONARY STENT INTERVENTION N/A 10/07/2022   Procedure: CORONARY STENT INTERVENTION;  Surgeon: Swaziland, Peter M, MD;  Location[facility] INVASIVE CV LAB;  Service: Cardiovascular;  Laterality: N/A;   CORONARY STENT INTERVENTION N/A 10/08/2022   Procedure: CORONARY STENT INTERVENTION;  Surgeon: Corky Crafts, MD;  Location: [facility] INVASIVE CV LAB;  Service: Cardiovascular;  Laterality: N/A;   CORONARY/GRAFT ACUTE MI REVASCULARIZATION N/A 10/05/2022   Procedure: Coronary/Graft Acute MI Revascularization;  Surgeon: Lennette Bihari, MD;  Location: [facility] INVASIVE CV LAB;  Service: Cardiovascular;  Laterality: N/A;   HERNIA REPAIR     INTRAVASCULAR ULTRASOUND/IVUS N/A 10/08/2022   Procedure: Intravascular Ultrasound/IVUS;  Surgeon: Corky Crafts, MD;  Location: [facility] INVASIVE CV LAB;  Service: Cardiovascular;  Laterality: N/A;   JOINT REPLACEMENT     scope   KNEE ARTHROSCOPY WITH MEDIAL MENISECTOMY Right 03/26/2014   Procedure: KNEE ARTHROSCOPY WITH MEDIAL  MENISECTOMY, POSSIBLE CHONDROPLASTY.;  Surgeon: Valeria Batman, MD;  Location: [facility] OR;  Service: Orthopedics;  Laterality: Right;   LEFT HEART CATH N/A 10/08/2022   Procedure: Left Heart Cath;  Surgeon: Corky Crafts, MD;  Location: [facility] INVASIVE CV LAB;  Service: Cardiovascular;  Laterality: N/A;   LEFT HEART CATH AND CORONARY ANGIOGRAPHY N/A 10/05/2022   Procedure: LEFT HEART CATH AND CORONARY ANGIOGRAPHY;  Surgeon: Lennette Bihari, MD;  Location: [facility] INVASIVE CV LAB;  Service: Cardiovascular;  Laterality: N/A;   ROTATOR CUFF REPAIR      Allergies  No Known Allergies  History of Present Illness    [name] is a 76 y.o. male with a hx of CAD s/p STEMI with DES LAD and OM1, HFrEF, ICM, LV apical thrombus, HLD, HTN last seen while hospitalized.  Admitted 12/12-12/16/23 after presenting with chest pain while deer hunting. He was visiting from [place]. Emergently taken to cath lab for STEMI with total occlusion of pLAX. PCI with placement of DES to LAD which jailed the large prox D1. Residual 90% stenosis of LCx. 10/06/22 went back to cath lab for attempted staged PCI of LCI but aborted due to difficulty accessing the right radial artery. Echo severe LV dysfunction LVEF 25-30%, akinesis of anterior wall and apex. 10/08/22 with staged PCI DES of large OM1. Due to spasm of right radial artery case done via right femoral artery. Repeat limited echo 10/07/22 with possible small apical LV thrombus and Eliquis initiated. Recommended for Aspirin and Brilinta x 1 month then Plavix for 1 year with Eliquis. LifeVest  placed prior to discharge.   Presents today for follow up with his daughter and granddaughter. Notes episodes of nosebleed and dark bloody urine on triple therapy. Hematuria has been worsening since being home. He does follow with urology regarding BPH. He is walking 8 minutes for exercise without significant dyspnea. No chest pain since discharge. We reviewed coronary stenting and  medical management of CAD, ICM.   EKGs/Labs/Other Studies Reviewed:   The following studies were reviewed today:  Cardiac catheterization 10/05/2022 LAD ostial 100; OM2 90 LVEDP 27, EF 35-40 with anterolateral and apical HK PCI: 3.5 x 18 mm Onyx Frontier DES to the LAD (D1 jailed)   Echocardiogram 10/06/2022 Mid septum/anterior wall, apical and apex akinetic, EF 25-30, mild concentric LVH, normal RVSF, mild AI, AV sclerosis without stenosis, RAP 3   Cardiac catheterization 10/07/2022 Failed access due to right radial artery perforation.  Procedure aborted   Limited echocardiogram with Definity contrast 10/07/2022 EF 25-30, no large LV thrombus, cannot exclude small true apical LV thrombus, mid and distal anterior septum, apical lateral segment, mid inferoseptal segment, apical anterior segment, apical inferior segment-akinetic   Cardiac catheterization 10/08/2022 LAD stent patent PCI: 3.5 x 16 mm Synergy DES to OM2  EKG:  EKG is ordered today.  The ekg ordered today demonstrates NSR 70 bpm with stable anterolateral TWI V2-V5.  Recent Labs: 10/05/2022: ALT 27 10/06/2022: Magnesium 1.8 10/15/2022: BUN 18; Creatinine, Ser 1.11; Hemoglobin 15.8; Platelets 252; Potassium 4.2; Sodium 136  Recent Lipid Panel    Component Value Date/Time   CHOL 245 (H) 10/05/2022 1040   TRIG 122 10/05/2022 1040   HDL 59 10/05/2022 1040   CHOLHDL 4.2 10/05/2022 1040   VLDL 24 10/05/2022 1040   LDLCALC 162 (H) 10/05/2022 1040    Home Medications   Current Meds  Medication Sig   acetaminophen (TYLENOL) 325 MG tablet Take 2 tablets (650 mg total) by mouth every 4 (four) hours as needed for headache or mild pain.   apixaban (ELIQUIS) 5 MG TABS tablet Take 1 tablet (5 mg total) by mouth 2 (two) times daily.   atorvastatin (LIPITOR) 80 MG tablet Take 1 tablet (80 mg total) by mouth daily.   clopidogrel (PLAVIX) 75 MG tablet Take 1 tablet (75 mg total) by mouth daily. START WHEN YOU RUN OUT OF  BRILINTA   losartan (COZAAR) 25 MG tablet Take 1 tablet (25 mg total) by mouth daily.   metoprolol succinate (TOPROL-XL) 50 MG 24 hr tablet Take 50 mg by mouth in the morning. Take with or immediately following a meal.   nitroGLYCERIN (NITROSTAT) 0.4 MG SL tablet Place 1 tablet (0.4 mg total) under the tongue every 5 (five) minutes as needed for chest pain.   pantoprazole (PROTONIX) 20 MG tablet Take 1 tablet (20 mg total) by mouth daily.   spironolactone (ALDACTONE) 25 MG tablet Take 1 tablet (25 mg total) by mouth daily.   tamsulosin (FLOMAX) 0.4 MG CAPS capsule Take 0.4 mg by mouth in the morning.   ticagrelor (BRILINTA) 90 MG TABS tablet Take 1 tablet (90 mg total) by mouth 2 (two) times daily.   [DISCONTINUED] aspirin 81 MG chewable tablet Chew 1 tablet (81 mg total) by mouth daily.   [DISCONTINUED] esomeprazole (NEXIUM) 40 MG capsule Take 40 mg by mouth daily as needed (for reflux).     Review of Systems      All other systems reviewed and are otherwise negative except as noted above.  Physical Exam    VS:  BP 120/70   Pulse 70   Ht 5\' 8"  (1.727 m)   Wt 191 lb (86.6 kg)   BMI 29.04 kg/m  , BMI Body mass index is 29.04 kg/m.  Wt Readings from Last 3 Encounters:  10/15/22 191 lb (86.6 kg)  10/06/22 191 lb 9.6 oz (86.9 kg)  03/26/14 211 lb 10 oz (96 kg)     GEN: Well nourished, well developed, in no acute distress. HEENT: normal. Neck: Supple, no JVD, carotid bruits, or masses. Cardiac: RRR, no murmurs, rubs, or gallops. No clubbing, cyanosis, edema.  Radials/PT 2+ and equal bilaterally.  Respiratory:  Respirations regular and unlabored, clear to auscultation bilaterally. GI: Soft, nontender, nondistended. MS: No deformity or atrophy. Skin: Warm and dry, no rash. R radial site healing appropriately without hematoma.  Neuro:  Strength and sensation are intact. Psych: Normal affect.  Assessment & Plan    Hematuria - Likely due to triple therapy ASA/Brilinta/Eliquis. Stop  Aspirin. Continue Brilinta/Eliquis. CBC today to assess for significant anemia. Recommend follow up with urology.   CAD - s/p 09/2022 STEMI with DES-LAD and DES-OM1. Stable with no anginal symptoms. Gradually increasing walking regimen. Stop Aspirin as above due to hematuria. Continue Brilinta 90mg  BID x 30 days then transition to Plavix 75mg  QD. Rx provided. Additional GDMT Metoprolol 50mg  QD, Atorvastatin 80mg  QD, PRN Nitroglycerin. Heart healthy diet and regular cardiovascular exercise encouraged.   He is appropriate to begin cardiac rehab.   GERD - Stop Nexium, start Protonix 20mg  QD due to interaction with Plavix. Continue to follow with PCP.   HLD, LDL goal <70 - 10/14/22 total cholesterol 245, triglycerides 122, HDL 59, LDL 162. 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 Continue Atorvastatin 80mg  QD. FLP/LFT in 6 weeks. If LDL not at goal <70, plan to add [id number] due to elevated Lp(a).   HFrEF / ICM - 10/07/22 LVEF 25-30%. Euvolemic and well compensated on exam. GDMT includes Losartan, Spironolactone, Metoprolol. BMP today. Consider transition to Entresto vs addition SGLT2i at follow up. Repeat echocardiogram in 3 months to be ordered at follow up. Continue LifeVest until repeat echocardiogram. If LVEF remaains <35%, plan to refer to EP for ICD. Low sodium diet, fluid restriction <2L, and daily weights encouraged. Educated to contact our office for weight gain of 2 lbs overnight or 5 lbs in one week.   LV thrombus / Hypercoagulable state - Noted by Echo 10/07/22. Continue Eliquis 5mg  BID. Repeat CT in 6 months to be ordered at follow up to reassess for LV thrombus. Hematuria management, as above.      Disposition: Follow up in 3 week(s) with , NP via video visit  Signed, , NP 10/19/2022, 10:31 AM  [facility]

## 2022-10-16 LAB — BASIC METABOLIC PANEL
BUN/Creatinine Ratio: 16 (ref 10–24)
BUN: 18 mg/dL (ref 8–27)
CO2: 19 mmol/L — ABNORMAL LOW (ref 20–29)
Calcium: 10.8 mg/dL — ABNORMAL HIGH (ref 8.6–10.2)
Chloride: 100 mmol/L (ref 96–106)
Creatinine, Ser: 1.11 mg/dL (ref 0.76–1.27)
Glucose: 91 mg/dL (ref 70–99)
Potassium: 4.2 mmol/L (ref 3.5–5.2)
Sodium: 136 mmol/L (ref 134–144)
eGFR: 70 mL/min/{1.73_m2} (ref >59)

## 2022-10-16 LAB — CBC
Hematocrit: 45.9 % (ref 37.5–51.0)
Hemoglobin: 15.8 g/dL (ref 13.0–17.7)
MCH: 32.6 pg (ref 26.6–33.0)
MCHC: 34.4 g/dL (ref 31.5–35.7)
MCV: 95 fL (ref 79–97)
Platelets: 252 10*3/uL (ref 150–450)
RBC: 4.84 x10E6/uL (ref 4.14–5.80)
RDW: 12.9 % (ref 11.6–15.4)
WBC: 8.7 10*3/uL (ref 3.4–10.8)

## 2022-10-19 ENCOUNTER — Encounter (HOSPITAL_BASED_OUTPATIENT_CLINIC_OR_DEPARTMENT_OTHER): Payer: Self-pay | Admitting: Family

## 2022-10-26 ENCOUNTER — Telehealth (HOSPITAL_COMMUNITY): Payer: Self-pay

## 2022-10-26 NOTE — Telephone Encounter (Signed)
Per phase I cardiac rehab, fax referral to [facility] cardiac rehab.

## 2022-11-05 ENCOUNTER — Encounter (HOSPITAL_BASED_OUTPATIENT_CLINIC_OR_DEPARTMENT_OTHER): Payer: Self-pay

## 2022-11-05 ENCOUNTER — Telehealth (INDEPENDENT_AMBULATORY_CARE_PROVIDER_SITE_OTHER): Payer: Medicare Other | Admitting: Family

## 2022-11-05 VITALS — Ht 68.0 in | Wt 184.0 lb

## 2022-11-05 DIAGNOSIS — D6859 Other primary thrombophilia: Secondary | ICD-10-CM

## 2022-11-05 DIAGNOSIS — K219 Gastro-esophageal reflux disease without esophagitis: Secondary | ICD-10-CM

## 2022-11-05 DIAGNOSIS — I255 Ischemic cardiomyopathy: Secondary | ICD-10-CM

## 2022-11-05 DIAGNOSIS — E785 Hyperlipidemia, unspecified: Secondary | ICD-10-CM

## 2022-11-05 DIAGNOSIS — I25118 Atherosclerotic heart disease of native coronary artery with other forms of angina pectoris: Secondary | ICD-10-CM | POA: Diagnosis not present

## 2022-11-05 DIAGNOSIS — I5020 Unspecified systolic (congestive) heart failure: Secondary | ICD-10-CM

## 2022-11-05 DIAGNOSIS — I513 Intracardiac thrombosis, not elsewhere classified: Secondary | ICD-10-CM

## 2022-11-05 MED ORDER — APIXABAN 5 MG PO TABS: 5.0000 mg | ORAL_TABLET | Freq: Two times a day (BID) | ORAL | 11 refills | Status: AC

## 2022-11-05 NOTE — Patient Instructions (Addendum)
Medication Instructions:  Your Physician recommend you continue on your current medication as directed.    USE UP BRILINTA SAMPLES THEN SWITCH TO PLAVIX  *If you need a refill on your cardiac medications before your next appointment, please call your pharmacy*   Lab Work: Your physician recommends that you return for lab work DURING THE WEEK OF 1/29-2/2 FOR FASTING LIPID PANEL, CMP, CBC AT [facility] [place]  If you have labs (blood work) drawn today and your tests are completely normal, you will receive your results only by: [place] (if you have MyChart) OR A paper copy in the mail If you have any lab test that is abnormal or we need to change your treatment, we will call you to review the results.   Testing/Procedures: Anticipate echocardiogram (ultrasound of your heart) the end of March Anticipate CT scan (to check on thrombus) the end of June   Follow-Up: At [facility], you and your health needs are our priority.  As part of our continuing mission to provide you with exceptional heart care, we have created designated Provider Care Teams.  These Care Teams include your primary Cardiologist (physician) and Advanced Practice Providers (APPs -  Physician Assistants and Nurse Practitioners) who all work together to provide you with the care you need, when you need it.  We recommend signing up for the patient portal called "MyChart".  Sign up information is provided on this After Visit Summary.  MyChart is used to connect with patients for Virtual Visits (Telemedicine).  Patients are able to view lab/test results, encounter notes, upcoming appointments, etc.  Non-urgent messages can be sent to your provider as well.   To learn more about what you can do with MyChart, go to [email or web address].    Your next appointment:   Follow up with [facility]   Other Instructions  We will send your records to [facility]. If you don't hear from them in  2 weeks, please call them to inquire regarding appointment.   [facility] [address] [place], [place] [address] Phone: [phone]

## 2022-11-05 NOTE — Telephone Encounter (Signed)
RN called pharmacy earlier who said Rx would be $600.   On further investigation his insurance in Epic as below is listed as Medicaid.    [facility] MAPD AND MA/RDS Lennette Bihari) Covered: Retail, Mail OrderUnknown: Specialty, Long-Term Care             Member ID: [id number] BIN: [id number]  DOB: 12-26-1945  Group ID: [id number] PCN: [id number]  Legal sex: M  Group name: MEDICAID  Address: [address]     [place] [address]   When I called and spoke with pharmacy they said it was unavailable for refill until 11/28/22 and that they were unable to tell the copay or his deductible. Unsure why one staff member at the pharmacy able to obtain more information than the other.   Will ask patient to ensure his pharmacy has most up to date insurance card.   Loel Dubonnet, NP

## 2022-11-05 NOTE — Progress Notes (Unsigned)
Virtual Visit via Video Note   Because of [name]'s co-morbid illnesses, he is at least at moderate risk for complications without adequate follow up.  This format is felt to be most appropriate for this patient at this time.  All issues noted in this document were discussed and addressed.  A limited physical exam was performed with this format.  Please refer to the patient's chart for his consent to telehealth for [facility].    Date:  11/07/2022   ID:  [name], DOB 10/14/1946, MRN [id number] The patient was identified using 2 identifiers.  Patient Location: Home Provider Location: Office/Clinic   PCP:  Cammy Copa, [place] [facility] Cardiologist:  Shelva Majestic, MD     Evaluation Performed:  Follow-Up Visit  Chief Complaint:  Follow up hematuria and CAD  History of Present Illness:    [name] is a 77 y.o. male with hx of CAD s/p STEMI with DES LAD and OM1, HFrEF, ICM, LV apical thrombus, HLD, HTN. Last seen 10/15/22.   Admitted 12/12-12/16/23 after presenting with chest pain while deer hunting. He was visiting from [place]. Emergently taken to cath lab for STEMI with total occlusion of pLAX. PCI with placement of DES to LAD which jailed the large prox D1. Residual 90% stenosis of LCx. 10/06/22 went back to cath lab for attempted staged PCI of LCI but aborted due to difficulty accessing the right radial artery. Echo severe LV dysfunction LVEF 25-30%, akinesis of anterior wall and apex. 10/08/22 with staged PCI DES of large OM1. Due to spasm of right radial artery case done via right femoral artery. Repeat limited echo 10/07/22 with possible small apical LV thrombus and Eliquis initiated. Recommended for Aspirin and Brilinta x 1 month then Plavix for 1 year with Eliquis. LifeVest placed prior to discharge.   Seen 10/15/22 in follow up. Noted hematuria and aspirin discontinued. Brilinta was transitioned to Plavix. Eliquis continued.  He was ambulating without exertional dyspnea nor chest pain. CBC for monitoring of hematuria Hb 15.8.  Presents today for follow up via video with his daughter. He is still noting hematuria intermittently but is following with urology. Pending possible cystoscopy pending urinalysis results. BP at home 110s-120s/70s. Plans to establish with [facility] cardiology in [place] where he lives.   Reports no shortness of breath nor dyspnea on exertion. Reports no chest pain, pressure, or tightness. No edema, orthopnea, PND. Reports no palpitations.    Past Medical History:  Diagnosis Date   GERD (gastroesophageal reflux disease)    H/O hiatal hernia    Hypertension    Past Surgical History:  Procedure Laterality Date   CORONARY BALLOON ANGIOPLASTY N/A 10/07/2022   Procedure: CORONARY BALLOON ANGIOPLASTY;  Surgeon: Martinique, Peter M, MD;  Location: [place] [facility];  Service: Cardiovascular;  Laterality: N/A;   CORONARY STENT INTERVENTION N/A 10/05/2022   Procedure: CORONARY STENT INTERVENTION;  Surgeon: Troy Sine, MD;  Location: [place] [facility];  Service: Cardiovascular;  Laterality: N/A;   CORONARY STENT INTERVENTION N/A 10/07/2022   Procedure: CORONARY STENT INTERVENTION;  Surgeon: Martinique, Peter M, MD;  Location: [place] [facility];  Service: Cardiovascular;  Laterality: N/A;   CORONARY STENT INTERVENTION N/A 10/08/2022   Procedure: CORONARY STENT INTERVENTION;  Surgeon: Jettie Booze, MD;  Location: [place] [facility];  Service: Cardiovascular;  Laterality: N/A;   CORONARY/GRAFT ACUTE MI REVASCULARIZATION N/A 10/05/2022   Procedure: Coronary/Graft Acute MI Revascularization;  Surgeon: Troy Sine,  MD;  Location: [facility];  Service: Cardiovascular;  Laterality: N/A;   HERNIA REPAIR     INTRAVASCULAR ULTRASOUND/IVUS N/A 10/08/2022   Procedure: Intravascular Ultrasound/IVUS;  Surgeon: Corky Crafts, MD;  Location: [facility];  Service: Cardiovascular;   Laterality: N/A;   JOINT REPLACEMENT     scope   KNEE ARTHROSCOPY WITH MEDIAL MENISECTOMY Right 03/26/2014   Procedure: KNEE ARTHROSCOPY WITH MEDIAL MENISECTOMY, POSSIBLE CHONDROPLASTY.;  Surgeon: Valeria Batman, MD;  Location: [facility] OR;  Service: Orthopedics;  Laterality: Right;   LEFT HEART CATH N/A 10/08/2022   Procedure: Left Heart Cath;  Surgeon: Corky Crafts, MD;  Location: [facility] INVASIVE CV LAB;  Service: Cardiovascular;  Laterality: N/A;   LEFT HEART CATH AND CORONARY ANGIOGRAPHY N/A 10/05/2022   Procedure: LEFT HEART CATH AND CORONARY ANGIOGRAPHY;  Surgeon: Lennette Bihari, MD;  Location: [facility];  Service: Cardiovascular;  Laterality: N/A;   ROTATOR CUFF REPAIR       Current Meds  Medication Sig   acetaminophen (TYLENOL) 325 MG tablet Take 2 tablets (650 mg total) by mouth every 4 (four) hours as needed for headache or mild pain.   atorvastatin (LIPITOR) 80 MG tablet Take 1 tablet (80 mg total) by mouth daily.   clopidogrel (PLAVIX) 75 MG tablet Take 1 tablet (75 mg total) by mouth daily. START WHEN YOU RUN OUT OF BRILINTA   losartan (COZAAR) 25 MG tablet Take 1 tablet (25 mg total) by mouth daily.   metoprolol succinate (TOPROL-XL) 50 MG 24 hr tablet Take 50 mg by mouth in the morning. Take with or immediately following a meal.   nitroGLYCERIN (NITROSTAT) 0.4 MG SL tablet Place 1 tablet (0.4 mg total) under the tongue every 5 (five) minutes as needed for chest pain.   pantoprazole (PROTONIX) 20 MG tablet Take 1 tablet (20 mg total) by mouth daily.   spironolactone (ALDACTONE) 25 MG tablet Take 1 tablet (25 mg total) by mouth daily.   tamsulosin (FLOMAX) 0.4 MG CAPS capsule Take 0.4 mg by mouth in the morning.   [DISCONTINUED] apixaban (ELIQUIS) 5 MG TABS tablet Take 1 tablet (5 mg total) by mouth 2 (two) times daily.   [DISCONTINUED] ticagrelor (BRILINTA) 90 MG TABS tablet Take 1 tablet (90 mg total) by mouth 2 (two) times daily.     Allergies:   Patient has no  known allergies.   Social History   Tobacco Use   Smoking status: Former    Packs/day: 1.00    Years: 25.00    Total pack years: 25.00    Types: Cigarettes    Quit date: 10/25/2006    Years since quitting: 16.0   Smokeless tobacco: Never  Vaping Use   Vaping Use: Never used  Substance Use Topics   Alcohol use: Yes    Alcohol/week: 19.0 standard drinks of alcohol    Types: 1 Glasses of wine, 18 Cans of beer per week    Comment: 2+ beer daily   Drug use: No     Family Hx: The patient's family history is not on file.  ROS:   Please see the history of present illness.     All other systems reviewed and are negative.   Prior CV studies:   The following studies were reviewed today:  Cardiac catheterization 10/05/2022 LAD ostial 100; OM2 90 LVEDP 27, EF 35-40 with anterolateral and apical HK PCI: 3.5 x 18 mm Onyx Frontier DES to the LAD (D1 jailed)  Echocardiogram 10/06/2022 Mid septum/anterior wall, apical and apex akinetic, EF 25-30, mild concentric LVH, normal RVSF, mild AI, AV sclerosis without stenosis, RAP 3   Cardiac catheterization 10/07/2022 Failed access due to right radial artery perforation.  Procedure aborted   Limited echocardiogram with Definity contrast 10/07/2022 EF 25-30, no large LV thrombus, cannot exclude small true apical LV thrombus, mid and distal anterior septum, apical lateral segment, mid inferoseptal segment, apical anterior segment, apical inferior segment-akinetic   Cardiac catheterization 10/08/2022 LAD stent patent PCI: 3.5 x 16 mm Synergy DES to OM2  Labs/Other Tests and Data Reviewed:    EKG:  No ECG reviewed.  Recent Labs: 10/05/2022: ALT 27 10/06/2022: Magnesium 1.8 10/15/2022: BUN 18; Creatinine, Ser 1.11; Hemoglobin 15.8; Platelets 252; Potassium 4.2; Sodium 136   Recent Lipid Panel Lab Results  Component Value Date/Time   CHOL 245 (H) 10/05/2022 10:40 AM   TRIG 122 10/05/2022 10:40 AM   HDL 59 10/05/2022 10:40 AM    CHOLHDL 4.2 10/05/2022 10:40 AM   LDLCALC 162 (H) 10/05/2022 10:40 AM    Wt Readings from Last 3 Encounters:  11/05/22 184 lb (83.5 kg)  10/15/22 191 lb (86.6 kg)  10/06/22 191 lb 9.6 oz (86.9 kg)        Objective:    Vital Signs:  Ht 5\' 8"  (1.727 m)   Wt 184 lb (83.5 kg)   BMI 27.98 kg/m    VITAL SIGNS:  reviewed GEN:  no acute distress RESPIRATORY:  normal respiratory effort, symmetric expansion CARDIOVASCULAR:  no peripheral edema  ASSESSMENT & PLAN:    Hematuria - Likely due to Plavix/Eliquis. Aspirin previously stopped. Is following with urology. As hematuria is intermittent at this time continue Plavix/Eliquis. Update CBC for monitoring to ensure no significant anemia.    CAD - s/p 09/2022 STEMI with DES-LAD and DES-OM1. Stable with no anginal symptoms. Brilinta previously discontinued due to cost.GDMT Metoprolol 50mg  QD, Atorvastatin 80mg  QD, Plavix 75mg  QD PRN Nitroglycerin. Heart healthy diet and regular cardiovascular exercise encouraged.  Encouraged to participate in cardiac rehab.   GERD - Continue Protonix 20mg  QD - alternate PPI previously discontinued due to use of Plavix. Continue to follow with PCP.    HLD, LDL goal <70 - 10/14/22 total cholesterol 245, triglycerides 122, HDL 59, LDL 162. 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 Continue Atorvastatin 80mg  QD. Update FLP/CMP as has completed 6 weeks of therapy. If LDL not at goal <70, would be good candidate for PCSK9i due to elevated Lp(a).    HFrEF / ICM - 10/07/22 LVEF 25-30%. Euvolemic and well compensated on exam. GDMT includes Losartan, Spironolactone, Metoprolol. No indication for loop diuretic at this time. As he is transitioning to cardiologist closer to him, defer transition to [facility] at this time. Future considerations include transition to Entresto vs addition SGLT2i. Repeat echocardiogram in 3 months to be coordinated by new cardiologist.   Continue LifeVest until repeat echocardiogram. If LVEF  remaains <35%, plan to refer to EP for ICD. Low sodium diet, fluid restriction <2L, and daily weights encouraged. Educated to contact our office for weight gain of 2 lbs overnight or 5 lbs in one week.    LV thrombus / Hypercoagulable state - Noted by Echo 10/07/22. Continue Eliquis 5mg  BID. Repeat CT in 6 months to be ordered by new cardiology team to reassess for LV thrombus. Hematuria management, as above.          Time:   Today, I have spent 12 minutes with the patient  with telehealth technology discussing the above problems.     Medication Adjustments/Labs and Tests Ordered: Current medicines are reviewed at length with the patient today.  Concerns regarding medicines are outlined above.   Tests Ordered: Orders Placed This Encounter  Procedures   CBC   Comprehensive metabolic panel   Lipid panel   Ambulatory referral to Cardiology    Medication Changes: No orders of the defined types were placed in this encounter.   Follow Up:  With [facility] in [place]. We did review that if he has difficulty scheduling follow up, echo in 3 mos, CT in 6 mos he can follow with our office PRN.   Signed, Loel Dubonnet, NP  11/07/2022 2:36 PM    [place]

## 2022-11-07 ENCOUNTER — Encounter (HOSPITAL_BASED_OUTPATIENT_CLINIC_OR_DEPARTMENT_OTHER): Payer: Self-pay | Admitting: Family

## 2022-11-24 ENCOUNTER — Other Ambulatory Visit (HOSPITAL_BASED_OUTPATIENT_CLINIC_OR_DEPARTMENT_OTHER): Payer: Self-pay | Admitting: Family

## 2022-11-25 LAB — LIPID PANEL W/O CHOL/HDL RATIO
Cholesterol, Total: 145 mg/dL (ref 100–199)
HDL: 50 mg/dL (ref >39)
LDL Chol Calc (NIH): 77 mg/dL (ref 0–99)
Triglycerides: 96 mg/dL (ref 0–149)
VLDL Cholesterol Cal: 18 mg/dL (ref 5–40)

## 2022-11-25 LAB — COMPREHENSIVE METABOLIC PANEL
ALT: 37 IU/L (ref 0–44)
AST: 25 IU/L (ref 0–40)
Albumin/Globulin Ratio: 2 (ref 1.2–2.2)
Albumin: 4.5 g/dL (ref 3.8–4.8)
Alkaline Phosphatase: 71 IU/L (ref 44–121)
BUN/Creatinine Ratio: 17 (ref 10–24)
BUN: 18 mg/dL (ref 8–27)
Bilirubin Total: 0.8 mg/dL (ref 0.0–1.2)
CO2: 20 mmol/L (ref 20–29)
Calcium: 9.9 mg/dL (ref 8.6–10.2)
Chloride: 106 mmol/L (ref 96–106)
Creatinine, Ser: 1.07 mg/dL (ref 0.76–1.27)
Globulin, Total: 2.3 g/dL (ref 1.5–4.5)
Glucose: 111 mg/dL — ABNORMAL HIGH (ref 70–99)
Potassium: 4 mmol/L (ref 3.5–5.2)
Sodium: 142 mmol/L (ref 134–144)
Total Protein: 6.8 g/dL (ref 6.0–8.5)
eGFR: 73 mL/min/{1.73_m2} (ref >59)

## 2022-11-25 LAB — CBC WITH DIFFERENTIAL/PLATELET
Basophils Absolute: 0.1 10*3/uL (ref 0.0–0.2)
Basos: 1 %
EOS (ABSOLUTE): 0.2 10*3/uL (ref 0.0–0.4)
Eos: 3 %
Hematocrit: 45.2 % (ref 37.5–51.0)
Hemoglobin: 15.6 g/dL (ref 13.0–17.7)
Immature Grans (Abs): 0 10*3/uL (ref 0.0–0.1)
Immature Granulocytes: 0 %
Lymphocytes Absolute: 1.9 10*3/uL (ref 0.7–3.1)
Lymphs: 37 %
MCH: 32.6 pg (ref 26.6–33.0)
MCHC: 34.5 g/dL (ref 31.5–35.7)
MCV: 95 fL (ref 79–97)
Monocytes Absolute: 0.4 10*3/uL (ref 0.1–0.9)
Monocytes: 8 %
Neutrophils Absolute: 2.6 10*3/uL (ref 1.4–7.0)
Neutrophils: 51 %
Platelets: 178 10*3/uL (ref 150–450)
RBC: 4.78 x10E6/uL (ref 4.14–5.80)
RDW: 12.6 % (ref 11.6–15.4)
WBC: 5.2 10*3/uL (ref 3.4–10.8)

## 2022-11-26 ENCOUNTER — Telehealth (HOSPITAL_BASED_OUTPATIENT_CLINIC_OR_DEPARTMENT_OTHER): Payer: Self-pay

## 2022-11-26 MED ORDER — REPATHA SURECLICK 140 MG/ML ~~LOC~~ SOAJ: 140.0000 mg | SUBCUTANEOUS | 6 refills | Status: AC

## 2022-11-26 NOTE — Telephone Encounter (Addendum)
Seen by patient [name] on 11/26/2022 11:18 AM; followup message sent to patient    ----- Message from Loel Dubonnet, NP sent at 11/25/2022  9:53 PM EST ----- Normal kidneys, liver, electrolytes. CBC with no anemia or infection. Cholesterol panel shows improvement. LDL was previously 162 now down to 77. Not yet at goal of <70. Given elevated cholesterol as well as elevated lipoprotein a (previous lab) would benefit from addition of PCSK9i. However, he is establishing with new cardiology care in [place]. We can send Rx and PA for Reapatha or if he wishes can discuss with new cardiology team if appt upcoming.

## 2022-11-26 NOTE — Addendum Note (Signed)
Addended by: Gerald Stabs on: 11/26/2022 01:50 PM   Modules accepted: Orders

## 2022-11-26 NOTE — Telephone Encounter (Signed)
Per cover my meds, Repatha prior auth approved through 05/27/2023. Patient updated via [employer].

## 2022-11-26 NOTE — Telephone Encounter (Signed)
Patient would like repatha sent into the pharm, advised patient that it would need prior auth and to wait until he hears from [place] to go pick up,   Prior auth submitted via cover my meds, with recent labs and office note.   Per cover my meds,   "[employer] is processing your PA request and will respond shortly with next steps. You may close this dialog, return to your dashboard, and perform other tasks. To check for an update later, open this request again from your dashboard.  If you need assistance, please chat with CoverMyMeds or call us at [phone]."

## 2022-11-26 NOTE — Telephone Encounter (Signed)
Clinical questions answered; awaiting determination
